# Patient Record
Sex: Male | Born: 1970
Health system: Southern US, Community
[De-identification: ages and names within clinical notes are randomized; demographics above are authoritative.]

## PROBLEM LIST (undated history)

## (undated) DIAGNOSIS — G4730 Sleep apnea, unspecified: Secondary | ICD-10-CM

## (undated) DIAGNOSIS — R6882 Decreased libido: Secondary | ICD-10-CM

## (undated) DIAGNOSIS — N410 Acute prostatitis: Secondary | ICD-10-CM

## (undated) DIAGNOSIS — E782 Mixed hyperlipidemia: Secondary | ICD-10-CM

## (undated) DIAGNOSIS — R5383 Other fatigue: Secondary | ICD-10-CM

## (undated) DIAGNOSIS — J40 Bronchitis, not specified as acute or chronic: Secondary | ICD-10-CM

## (undated) DIAGNOSIS — R5381 Other malaise: Secondary | ICD-10-CM

## (undated) DIAGNOSIS — C801 Malignant (primary) neoplasm, unspecified: Secondary | ICD-10-CM

## (undated) DIAGNOSIS — G4700 Insomnia, unspecified: Secondary | ICD-10-CM

## (undated) DIAGNOSIS — M47816 Spondylosis without myelopathy or radiculopathy, lumbar region: Secondary | ICD-10-CM

## (undated) DIAGNOSIS — IMO0002 Reserved for concepts with insufficient information to code with codable children: Secondary | ICD-10-CM

## (undated) HISTORY — DX: Spondylosis without myelopathy or radiculopathy, lumbar region: M47.816

## (undated) HISTORY — DX: Other malaise: R53.81

## (undated) HISTORY — PX: HERNIA REPAIR: SHX51

## (undated) HISTORY — PX: APPENDECTOMY: SHX54

## (undated) HISTORY — DX: Sleep apnea, unspecified: G47.30

## (undated) HISTORY — DX: Other malaise: R53.83

## (undated) HISTORY — DX: Mixed hyperlipidemia: E78.2

## (undated) HISTORY — DX: Insomnia, unspecified: G47.00

## (undated) HISTORY — DX: Acute prostatitis: N41.0

## (undated) HISTORY — DX: Decreased libido: R68.82

## (undated) HISTORY — DX: Reserved for concepts with insufficient information to code with codable children: IMO0002

## (undated) HISTORY — DX: Malignant (primary) neoplasm, unspecified: C80.1

## (undated) HISTORY — DX: Bronchitis, not specified as acute or chronic: J40

---

## 2006-02-16 ENCOUNTER — Emergency Department (HOSPITAL_COMMUNITY): Admission: EM | Admit: 2006-02-16 | Discharge: 2006-02-16 | Payer: Self-pay | Admitting: Emergency Medicine

## 2006-12-07 ENCOUNTER — Ambulatory Visit: Payer: Self-pay | Admitting: Family Medicine

## 2008-11-09 ENCOUNTER — Ambulatory Visit: Payer: Self-pay | Admitting: Specialist

## 2008-11-23 ENCOUNTER — Ambulatory Visit: Payer: Self-pay | Admitting: Specialist

## 2009-01-07 ENCOUNTER — Ambulatory Visit: Payer: Self-pay | Admitting: Gastroenterology

## 2012-03-31 ENCOUNTER — Ambulatory Visit: Payer: Self-pay | Admitting: Family Medicine

## 2013-10-25 ENCOUNTER — Encounter: Payer: Self-pay | Admitting: *Deleted

## 2013-11-02 ENCOUNTER — Ambulatory Visit (INDEPENDENT_AMBULATORY_CARE_PROVIDER_SITE_OTHER): Payer: 59 | Admitting: Cardiovascular Disease

## 2013-11-02 ENCOUNTER — Encounter (INDEPENDENT_AMBULATORY_CARE_PROVIDER_SITE_OTHER): Payer: Self-pay

## 2013-11-02 ENCOUNTER — Encounter: Payer: Self-pay | Admitting: Cardiovascular Disease

## 2013-11-02 VITALS — BP 130/86 | HR 67 | Ht 69.0 in | Wt 204.5 lb

## 2013-11-02 DIAGNOSIS — R079 Chest pain, unspecified: Secondary | ICD-10-CM

## 2013-11-02 DIAGNOSIS — R0789 Other chest pain: Secondary | ICD-10-CM

## 2013-11-02 DIAGNOSIS — E782 Mixed hyperlipidemia: Secondary | ICD-10-CM

## 2013-11-02 NOTE — Patient Instructions (Addendum)
Your physician has requested that you have a stress echocardiogram. For further information please visit [email or web address]. Please follow instruction sheet as given. -Wear comfortable  -Tennis shoes  - You can eat and take your meds     Your physician recommends that you schedule a follow-up appointment in:  As needed

## 2013-11-02 NOTE — Assessment & Plan Note (Signed)
He was recently started on atorvastatin. He reports no side effects.

## 2013-11-02 NOTE — Assessment & Plan Note (Signed)
Chest pain is overall atypical but he has associated dyspnea. He does have risk factors for coronary artery disease. Cardiac exam is normal and baseline ECG is unremarkable. I recommend evaluation with a stress echocardiogram. I discussed with the patient the importance of lifestyle changes in order to decrease the chance of future coronary artery disease and cardiovascular events. We discussed the importance of controlling risk factors, healthy diet as well as regular exercise. I also explained to him that a normal stress test does not rule out atherosclerosis.

## 2013-11-02 NOTE — Progress Notes (Signed)
Primary care physician: Dr. Rutherford Nail  HPI  This is a pleasant 43 year old man who was referred for evaluation of chest pain. He has no previous cardiac history. He has known history of hyperlipidemia and was started recently on atorvastatin. He has no history of hypertension, diabetes or tobacco use. He reports family history of coronary artery disease as his father and grandfather had myocardial infarction and congestive heart failure in the 48s. He had an episode of sharp chest pain lasting for about 3 minutes radiating to his back and shoulder back in May. The episode happened at rest and not with physical activities. He reports recurrent symptoms of shortness of breath which is random and happens when he is under stress. He exercises regularly. 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   No Known Allergies   Current Outpatient Prescriptions on File Prior to Visit  Medication Sig Dispense Refill  . clotrimazole-betamethasone (LOTRISONE) cream Apply 1 application topically 2 (two) times daily as needed.       No current facility-administered medications on file prior to visit.     Past Medical History  Diagnosis Date  . Bronchitis   . Decreased libido   . Thoracic or lumbosacral neuritis or radiculitis, unspecified   . Other malaise and fatigue   . Insomnia, unspecified   . Acute prostatitis   . Mixed hyperlipidemia      Past Surgical History  Procedure Laterality Date  . Appendectomy    . Hernia repair       Family History  Problem Relation Age of Onset  . Heart disease Father   . Heart attack Father   . Hypertension Father   . Hyperlipidemia Father      History   Social History  . Marital Status: Single    Spouse Name: N/A    Number of Children: N/A  . Years of Education: N/A   Occupational History  . Not on file.   Social History Main Topics  . Smoking status: Never Smoker   . Smokeless tobacco: Not on  file  . Alcohol Use: Yes     Comment: occasional  . Drug Use: No  . Sexual Activity: Not on file   Other Topics Concern  . Not on file   Social History Narrative  . No narrative on file     ROS A 10 point review of system was performed. It is negative other than that mentioned in the history of present illness.   PHYSICAL EXAM   BP 130/86  Pulse 67  Ht 5\' 9"  (1.753 m)  Wt 204 lb 8 oz (92.761 kg)  BMI 30.19 kg/m2 Constitutional: He is oriented to person, place, and time. He appears well-developed and well-nourished. No distress.  HENT: No nasal discharge.  Head: Normocephalic and atraumatic.  Eyes: Pupils are equal and round.  No discharge. Neck: Normal range of motion. Neck supple. No JVD present. No thyromegaly present.  Cardiovascular: Normal rate, regular rhythm, normal heart sounds. Exam reveals no gallop and no friction rub. No murmur heard.  Pulmonary/Chest: Effort normal and breath sounds normal. No stridor. No respiratory distress. He has no wheezes. He has no rales. He exhibits no tenderness.  Abdominal: Soft. Bowel sounds are normal. He exhibits no distension. There is no tenderness. There is no rebound and no guarding.  Musculoskeletal: Normal range of motion. He exhibits no edema and no tenderness.  Neurological: He is alert and oriented to person, place,  and time. Coordination normal.  Skin: Skin is warm and dry. No rash noted. He is not diaphoretic. No erythema. No pallor.  Psychiatric: He has a normal mood and affect. His behavior is normal. Judgment and thought content normal.       BZJ:IRCVE  Rhythm  - occasional PAC    # PACs = 1. WITHIN NORMAL LIMITS    ASSESSMENT AND PLAN

## 2013-11-09 ENCOUNTER — Telehealth: Payer: Self-pay

## 2013-11-09 NOTE — Telephone Encounter (Signed)
l mom to schedule echo stress

## 2013-12-04 NOTE — Telephone Encounter (Signed)
Spoke with patient  Reviewed stress echo instructions  Patient verbalized understanding

## 2013-12-05 ENCOUNTER — Other Ambulatory Visit (INDEPENDENT_AMBULATORY_CARE_PROVIDER_SITE_OTHER): Payer: 59

## 2013-12-05 DIAGNOSIS — R079 Chest pain, unspecified: Secondary | ICD-10-CM

## 2013-12-05 DIAGNOSIS — R0602 Shortness of breath: Secondary | ICD-10-CM

## 2013-12-06 NOTE — Progress Notes (Signed)
LVM 9/16

## 2015-08-12 ENCOUNTER — Encounter: Payer: Self-pay | Admitting: Family Medicine

## 2015-08-12 ENCOUNTER — Ambulatory Visit (INDEPENDENT_AMBULATORY_CARE_PROVIDER_SITE_OTHER): Payer: 59 | Admitting: Family Medicine

## 2015-08-12 VITALS — BP 136/84 | HR 74 | Temp 97.6°F | Resp 16 | Wt 206.0 lb

## 2015-08-12 DIAGNOSIS — R6882 Decreased libido: Secondary | ICD-10-CM

## 2015-08-12 DIAGNOSIS — Z125 Encounter for screening for malignant neoplasm of prostate: Secondary | ICD-10-CM | POA: Diagnosis not present

## 2015-08-12 DIAGNOSIS — E782 Mixed hyperlipidemia: Secondary | ICD-10-CM

## 2015-08-12 DIAGNOSIS — R351 Nocturia: Secondary | ICD-10-CM | POA: Diagnosis not present

## 2015-08-12 DIAGNOSIS — G4730 Sleep apnea, unspecified: Secondary | ICD-10-CM | POA: Diagnosis not present

## 2015-08-12 DIAGNOSIS — R350 Frequency of micturition: Secondary | ICD-10-CM | POA: Diagnosis not present

## 2015-08-12 DIAGNOSIS — Z0000 Encounter for general adult medical examination without abnormal findings: Secondary | ICD-10-CM | POA: Diagnosis not present

## 2015-08-12 HISTORY — DX: Sleep apnea, unspecified: G47.30

## 2015-08-12 NOTE — Progress Notes (Signed)
Patient ID: Willie Jones, male   DOB: [birth year], 45 y.o.   MRN: [id number]   Subjective:   Willie Jones is a 45 y.o. male here for a complete physical exam  Interim issues since last visit: wife wanted him to mention sleep apnea; already did the sleep study; Dr. Rutherford Nail ordered the test, supposed to wearing CPAP; never got it; avoiding sleeping on his back; fatigued and tired through the day; not refreshing sleep; fit bit helping him see he only gets 3.5 to 4 hours a night; years of rotating shifts, slightest thing wakes him  USPSTF grade A and B recommendations Alcohol: just occasional Depression:  Depression screen [facility] 2/9 08/12/2015  Decreased Interest 0  Down, Depressed, Hopeless 0  PHQ - 2 Score 0  Hypertension: higher than normal recently Obesity: would like to lose a little Tobacco use: never HIV, hep B, hep C: has been tested STD testing and prevention (chl/gon/syphilis): not needed Lipids: today Glucose: today Colorectal cancer: no one in the fam; start at age 64 Breast cancer: no lumps Lung cancer: n/a Osteoporosis: n/a AAA: n/a Aspirin: start baby aspirin 81 mg coated daily Diet: limiting portions Exercise: very physically fit Skin cancer: hx of skin cancer; sees derm regularly; no new moles  Past Medical History  Diagnosis Date  . Bronchitis   . Decreased libido   . Thoracic or lumbosacral neuritis or radiculitis, unspecified   . Other malaise and fatigue   . Insomnia, unspecified   . Acute prostatitis   . Mixed hyperlipidemia   . Cancer (HCC)     skin(face)  . Sleep apnea 08/12/2015  hx of Lyme disease; Dr. Clayborn Bigness  Past Surgical History  Procedure Laterality Date  . Appendectomy    . Hernia repair     Family History  Problem Relation Age of Onset  . Heart disease Father   . Heart attack Father   . Hypertension Father   . Hyperlipidemia Father    Social History  Substance Use Topics  . Smoking status: Never Smoker   . Smokeless tobacco:  Not on file  . Alcohol Use: Yes     Comment: occasional   Review of Systems  Constitutional: Negative for unexpected weight change.  HENT: Positive for hearing loss (might be slipping a little; declined audiologist for now).   Eyes: Positive for visual disturbance (wears glasses).  Respiratory: Positive for shortness of breath (with exercise, comes quicker than it should he thinks; did stress test already; will get OSA treated, he'll let me know if continuing).   Cardiovascular: Negative for chest pain.  Gastrointestinal: Negative for blood in stool.  Endocrine: Negative for polydipsia.  Genitourinary: Negative for hematuria.  Musculoskeletal: Positive for back pain (some back pain after accident, not taking naproxen for a long time).  Allergic/Immunologic: Negative for food allergies.  Neurological: Negative for tremors.  Hematological: Does not bruise/bleed easily.  Psychiatric/Behavioral: Negative for dysphoric mood.   Objective:   Filed Vitals:   08/12/15 0821  BP: 136/84  Pulse: 74  Temp: 97.6 F (36.4 C)  TempSrc: Oral  Resp: 16  Weight: 206 lb (93.441 kg)  SpO2: 97%   Body mass index is 30.41 kg/(m^2). Wt Readings from Last 3 Encounters:  08/12/15 206 lb (93.441 kg)  11/02/13 204 lb 8 oz (92.761 kg)  fair amount of muscle mass  Physical Exam  Constitutional: He appears well-developed and well-nourished. No distress.  Obese  HENT:  Head: Normocephalic and atraumatic.  Nose: Nose normal.  Mouth/Throat: Oropharynx is clear and moist.  Eyes: EOM are normal. No scleral icterus.  Neck: No JVD present. No thyromegaly present.  Cardiovascular: Normal rate, regular rhythm and normal heart sounds.   Pulmonary/Chest: Effort normal and breath sounds normal. No respiratory distress. He has no wheezes. He has no rales.  Abdominal: Soft. Bowel sounds are normal. He exhibits no distension. There is no tenderness. There is no guarding.  Musculoskeletal: Normal range of motion.  He exhibits no edema.  Lymphadenopathy:    He has no cervical adenopathy.  Neurological: He is alert. He displays normal reflexes. He exhibits normal muscle tone. Coordination normal.  Skin: Skin is warm and dry. No rash noted. He is not diaphoretic. No erythema. No pallor.  Psychiatric: He has a normal mood and affect. His behavior is normal. Judgment and thought content normal.    Assessment/Plan:   Problem List Items Addressed This Visit      Other   Decreased libido   Relevant Orders   Testosterone,Free and Total   Mixed hyperlipidemia    Not on statin any more; did biometric screening at work, total 199; HDL was too low, HDL; recheck today      Nocturia   Preventative health care - Primary   Relevant Orders   CBC with Differential/Platelet (Completed)   Lipid Panel w/o Chol/HDL Ratio (Completed)   Comprehensive metabolic panel (Completed)   TSH (Completed)   Sleep apnea   Relevant Orders   Ambulatory referral to Neurology   Urinary frequency    Other Visit Diagnoses    Prostate cancer screening        Relevant Orders    PSA (Completed)       No orders of the defined types were placed in this encounter.   Orders Placed This Encounter  Procedures  . CBC with Differential/Platelet  . Lipid Panel w/o Chol/HDL Ratio    Order Specific Question:  Has the patient fasted?    Answer:  Yes  . Comprehensive metabolic panel    Order Specific Question:  Has the patient fasted?    Answer:  Yes  . TSH  . Testosterone,Free and Total  . PSA  . Ambulatory referral to Neurology    Referral Priority:  Medium    Referral Type:  Consultation    Referral Reason:  Specialty Services Required    Requested Specialty:  Neurology    Number of Visits Requested:  1   Follow up plan: Return in about 1 year (around 08/11/2016) for complete physical.  An after-visit summary was printed and given to the patient at [facility].  Please see the patient instructions which may contain other  information and recommendations beyond what is mentioned above in the assessment and plan.

## 2015-08-12 NOTE — Assessment & Plan Note (Signed)
Not on statin any more; did biometric screening at work, total 199; HDL was too low, HDL; recheck today

## 2015-08-12 NOTE — Patient Instructions (Addendum)
Your goal blood pressure is less than 140 mmHg on top. Try to follow the DASH guidelines (DASH stands for Dietary Approaches to Stop Hypertension) Try to limit the sodium in your diet.  Ideally, consume less than 1.5 grams (less than 1,500mg ) per day. Do not add salt when cooking or at the table.  Check the sodium amount on labels when shopping, and choose items lower in sodium when given a choice. Avoid or limit foods that already contain a lot of sodium. Eat a diet rich in fruits and vegetables and whole grains.    Health Maintenance, Male A healthy lifestyle and preventative care can promote health and wellness.  Maintain regular health, dental, and eye exams.  Eat a healthy diet. Foods like vegetables, fruits, whole grains, low-fat dairy products, and lean protein foods contain the nutrients you need and are low in calories. Decrease your intake of foods high in solid fats, added sugars, and salt. Get information about a proper diet from your health care provider, if necessary.  Regular physical exercise is one of the most important things you can do for your health. Most adults should get at least 150 minutes of moderate-intensity exercise (any activity that increases your heart rate and causes you to sweat) each week. In addition, most adults need muscle-strengthening exercises on 2 or more days a week.   Maintain a healthy weight. The body mass index (BMI) is a screening tool to identify possible weight problems. It provides an estimate of body fat based on height and weight. Your health care provider can find your BMI and can help you achieve or maintain a healthy weight. For males 20 years and older:  A BMI below 18.5 is considered underweight.  A BMI of 18.5 to 24.9 is normal.  A BMI of 25 to 29.9 is considered overweight.  A BMI of 30 and above is considered obese.  Maintain normal blood lipids and cholesterol by exercising and minimizing your intake of saturated fat. Eat a  balanced diet with plenty of fruits and vegetables. Blood tests for lipids and cholesterol should begin at age 77 and be repeated every 5 years. If your lipid or cholesterol levels are high, you are over age 89, or you are at high risk for heart disease, you may need your cholesterol levels checked more frequently.Ongoing high lipid and cholesterol levels should be treated with medicines if diet and exercise are not working.  If you smoke, find out from your health care provider how to quit. If you do not use tobacco, do not start.  Lung cancer screening is recommended for adults aged 75-80 years who are at high risk for developing lung cancer because of a history of smoking. A yearly low-dose CT scan of the lungs is recommended for people who have at least a 30-pack-year history of smoking and are current smokers or have quit within the past 15 years. A pack year of smoking is smoking an average of 1 pack of cigarettes a day for 1 year (for example, a 30-pack-year history of smoking could mean smoking 1 pack a day for 30 years or 2 packs a day for 15 years). Yearly screening should continue until the smoker has stopped smoking for at least 15 years. Yearly screening should be stopped for people who develop a health problem that would prevent them from having lung cancer treatment.  If you choose to drink alcohol, do not have more than 2 drinks per day. One drink is considered to be  12 oz (360 mL) of beer, 5 oz (150 mL) of wine, or 1.5 oz (45 mL) of liquor.  Avoid the use of street drugs. Do not share needles with anyone. Ask for help if you need support or instructions about stopping the use of drugs.  High blood pressure causes heart disease and increases the risk of stroke. High blood pressure is more likely to develop in:  People who have blood pressure in the end of the normal range (100-139/85-89 mm Hg).  People who are overweight or obese.  People who are African American.  If you are 18-39  years of age, have your blood pressure checked every 3-5 years. If you are 40 years of age or older, have your blood pressure checked every year. You should have your blood pressure measured twice--once when you are at a hospital or clinic, and once when you are not at a hospital or clinic. Record the average of the two measurements. To check your blood pressure when you are not at a hospital or clinic, you can use:  An automated blood pressure machine at a pharmacy.  A home blood pressure monitor.  If you are 45-79 years old, ask your health care provider if you should take aspirin to prevent heart disease.  Diabetes screening involves taking a blood sample to check your fasting blood sugar level. This should be done once every 3 years after age 45 if you are at a normal weight and without risk factors for diabetes. Testing should be considered at a younger age or be carried out more frequently if you are overweight and have at least 1 risk factor for diabetes.  Colorectal cancer can be detected and often prevented. Most routine colorectal cancer screening begins at the age of 50 and continues through age 75. However, your health care provider may recommend screening at an earlier age if you have risk factors for colon cancer. On a yearly basis, your health care provider may provide home test kits to check for hidden blood in the stool. A small camera at the end of a tube may be used to directly examine the colon (sigmoidoscopy or colonoscopy) to detect the earliest forms of colorectal cancer. Talk to your health care provider about this at age 50 when routine screening begins. A direct exam of the colon should be repeated every 5-10 years through age 75, unless early forms of precancerous polyps or small growths are found.  People who are at an increased risk for hepatitis B should be screened for this virus. You are considered at high risk for hepatitis B if:  You were born in a country where  hepatitis B occurs often. Talk with your health care provider about which countries are considered high risk.  Your parents were born in a high-risk country and you have not received a shot to protect against hepatitis B (hepatitis B vaccine).  You have HIV or AIDS.  You use needles to inject street drugs.  You live with, or have sex with, someone who has hepatitis B.  You are a man who has sex with other men (MSM).  You get hemodialysis treatment.  You take certain medicines for conditions like cancer, organ transplantation, and autoimmune conditions.  Hepatitis C blood testing is recommended for all people born from 1945 through 1965 and any individual with known risk factors for hepatitis C.  Healthy men should no longer receive prostate-specific antigen (PSA) blood tests as part of routine cancer screening. Talk to your   health care provider about prostate cancer screening.  Testicular cancer screening is not recommended for adolescents or adult males who have no symptoms. Screening includes self-exam, a health care provider exam, and other screening tests. Consult with your health care provider about any symptoms you have or any concerns you have about testicular cancer.  Practice safe sex. Use condoms and avoid high-risk sexual practices to reduce the spread of sexually transmitted infections (STIs).  You should be screened for STIs, including gonorrhea and chlamydia if:  You are sexually active and are younger than 24 years.  You are older than 24 years, and your health care provider tells you that you are at risk for this type of infection.  Your sexual activity has changed since you were last screened, and you are at an increased risk for chlamydia or gonorrhea. Ask your health care provider if you are at risk.  If you are at risk of being infected with HIV, it is recommended that you take a prescription medicine daily to prevent HIV infection. This is called pre-exposure  prophylaxis (PrEP). You are considered at risk if:  You are a man who has sex with other men (MSM).  You are a heterosexual man who is sexually active with multiple partners.  You take drugs by injection.  You are sexually active with a partner who has HIV.  Talk with your health care provider about whether you are at high risk of being infected with HIV. If you choose to begin PrEP, you should first be tested for HIV. You should then be tested every 3 months for as long as you are taking PrEP.  Use sunscreen. Apply sunscreen liberally and repeatedly throughout the day. You should seek shade when your shadow is shorter than you. Protect yourself by wearing long sleeves, pants, a wide-brimmed hat, and sunglasses year round whenever you are outdoors.  Tell your health care provider of new moles or changes in moles, especially if there is a change in shape or color. Also, tell your health care provider if a mole is larger than the size of a pencil eraser.  A one-time screening for abdominal aortic aneurysm (AAA) and surgical repair of large AAAs by ultrasound is recommended for men aged 73-75 years who are current or former smokers.  Stay current with your vaccines (immunizations).   This information is not intended to replace advice given to you by your health care provider. Make sure you discuss any questions you have with your health care provider.   Document Released: 09/05/2007 Document Revised: 03/30/2014 Document Reviewed: 08/04/2010 Elsevier Interactive Patient Education 2016 [place] DASH stands for "Dietary Approaches to Stop Hypertension." The DASH eating plan is a healthy eating plan that has been shown to reduce high blood pressure (hypertension). Additional health benefits may include reducing the risk of type 2 diabetes mellitus, heart disease, and stroke. The DASH eating plan may also help with weight loss. WHAT DO I NEED TO KNOW ABOUT THE DASH EATING  PLAN? For the DASH eating plan, you will follow these general guidelines:  Choose foods with a percent daily value for sodium of less than 5% (as listed on the food label).  Use salt-free seasonings or herbs instead of table salt or sea salt.  Check with your health care provider or pharmacist before using salt substitutes.  Eat lower-sodium products, often labeled as "lower sodium" or "no salt added."  Eat fresh foods.  Eat more vegetables, fruits, and low-fat dairy products.  Choose whole grains. Look for the word "whole" as the first word in the ingredient list.  Choose fish and skinless chicken or [place] more often than red meat. Limit fish, poultry, and meat to 6 oz (170 g) each day.  Limit sweets, desserts, sugars, and sugary drinks.  Choose heart-healthy fats.  Limit cheese to 1 oz (28 g) per day.  Eat more home-cooked food and less restaurant, buffet, and fast food.  Limit fried foods.  Cook foods using methods other than frying.  Limit canned vegetables. If you do use them, rinse them well to decrease the sodium.  When eating at a restaurant, ask that your food be prepared with less salt, or no salt if possible. WHAT FOODS CAN I EAT? Seek help from a dietitian for individual calorie needs. Grains Whole grain or whole wheat bread. Brown rice. Whole grain or whole wheat pasta. Quinoa, bulgur, and whole grain cereals. Low-sodium cereals. Corn or whole wheat flour tortillas. Whole grain cornbread. Whole grain crackers. Low-sodium crackers. Vegetables Fresh or frozen vegetables (raw, steamed, roasted, or grilled). Low-sodium or reduced-sodium tomato and vegetable juices. Low-sodium or reduced-sodium tomato sauce and paste. Low-sodium or reduced-sodium canned vegetables.  Fruits All fresh, canned (in natural juice), or frozen fruits. Meat and Other Protein Products Ground beef (85% or leaner), grass-fed beef, or beef trimmed of fat. Skinless chicken or [place]. Ground  chicken or [place]. Pork trimmed of fat. All fish and seafood. Eggs. Dried beans, peas, or lentils. Unsalted nuts and seeds. Unsalted canned beans. Dairy Low-fat dairy products, such as skim or 1% milk, 2% or reduced-fat cheeses, low-fat ricotta or cottage cheese, or plain low-fat yogurt. Low-sodium or reduced-sodium cheeses. Fats and Oils Tub margarines without trans fats. Light or reduced-fat mayonnaise and salad dressings (reduced sodium). Avocado. Safflower, olive, or canola oils. Natural peanut or almond butter. Other Unsalted popcorn and pretzels. The items listed above may not be a complete list of recommended foods or beverages. Contact your dietitian for more options. WHAT FOODS ARE NOT RECOMMENDED? Grains White bread. White pasta. White rice. Refined cornbread. Bagels and croissants. Crackers that contain trans fat. Vegetables Creamed or fried vegetables. Vegetables in a cheese sauce. Regular canned vegetables. Regular canned tomato sauce and paste. Regular tomato and vegetable juices. Fruits Dried fruits. Canned fruit in light or heavy syrup. Fruit juice. Meat and Other Protein Products Fatty cuts of meat. Ribs, chicken wings, bacon, sausage, bologna, salami, chitterlings, fatback, hot dogs, bratwurst, and packaged luncheon meats. Salted nuts and seeds. Canned beans with salt. Dairy Whole or 2% milk, cream, half-and-half, and cream cheese. Whole-fat or sweetened yogurt. Full-fat cheeses or blue cheese. Nondairy creamers and whipped toppings. Processed cheese, cheese spreads, or cheese curds. Condiments Onion and garlic salt, seasoned salt, table salt, and sea salt. Canned and packaged gravies. Worcestershire sauce. Tartar sauce. Barbecue sauce. Teriyaki sauce. Soy sauce, including reduced sodium. Steak sauce. Fish sauce. Oyster sauce. Cocktail sauce. Horseradish. Ketchup and mustard. Meat flavorings and tenderizers. Bouillon cubes. Hot sauce. Tabasco sauce. Marinades. Taco seasonings.  Relishes. Fats and Oils Butter, stick margarine, lard, shortening, ghee, and bacon fat. Coconut, palm kernel, or palm oils. Regular salad dressings. Other Pickles and olives. Salted popcorn and pretzels. The items listed above may not be a complete list of foods and beverages to avoid. Contact your dietitian for more information. WHERE CAN I FIND MORE INFORMATION? National Heart, Lung, and Blood Institute: travelstabloid.com   This information is not intended to replace advice given to you by your health  care provider. Make sure you discuss any questions you have with your health care provider.   Document Released: 02/26/2011 Document Revised: 03/30/2014 Document Reviewed: 01/11/2013 Elsevier Interactive Patient Education [employer].

## 2015-08-13 ENCOUNTER — Other Ambulatory Visit: Payer: Self-pay | Admitting: Family Medicine

## 2015-08-13 LAB — COMPREHENSIVE METABOLIC PANEL
A/G RATIO: 1.7 (ref 1.2–2.2)
ALK PHOS: 88 IU/L (ref 39–117)
ALT: 15 IU/L (ref 0–44)
AST: 18 IU/L (ref 0–40)
Albumin: 4.3 g/dL (ref 3.5–5.5)
BILIRUBIN TOTAL: 0.3 mg/dL (ref 0.0–1.2)
BUN / CREAT RATIO: 13 (ref 9–20)
BUN: 13 mg/dL (ref 6–24)
CHLORIDE: 98 mmol/L (ref 96–106)
CO2: 23 mmol/L (ref 18–29)
Calcium: 9.2 mg/dL (ref 8.7–10.2)
Creatinine, Ser: 1.01 mg/dL (ref 0.76–1.27)
GFR calc Af Amer: 104 mL/min/{1.73_m2} (ref >59)
GFR calc non Af Amer: 90 mL/min/{1.73_m2} (ref >59)
GLUCOSE: 94 mg/dL (ref 65–99)
Globulin, Total: 2.5 g/dL (ref 1.5–4.5)
POTASSIUM: 4.2 mmol/L (ref 3.5–5.2)
Sodium: 138 mmol/L (ref 134–144)
Total Protein: 6.8 g/dL (ref 6.0–8.5)

## 2015-08-13 LAB — CBC WITH DIFFERENTIAL/PLATELET
Basophils Absolute: 0 10*3/uL (ref 0.0–0.2)
Basos: 0 %
EOS (ABSOLUTE): 0.1 10*3/uL (ref 0.0–0.4)
EOS: 1 %
HEMATOCRIT: 44.9 % (ref 37.5–51.0)
HEMOGLOBIN: 15.5 g/dL (ref 12.6–17.7)
IMMATURE GRANS (ABS): 0 10*3/uL (ref 0.0–0.1)
IMMATURE GRANULOCYTES: 0 %
LYMPHS: 26 %
Lymphocytes Absolute: 2.2 10*3/uL (ref 0.7–3.1)
MCH: 30.8 pg (ref 26.6–33.0)
MCHC: 34.5 g/dL (ref 31.5–35.7)
MCV: 89 fL (ref 79–97)
MONOCYTES: 7 %
Monocytes Absolute: 0.6 10*3/uL (ref 0.1–0.9)
NEUTROS PCT: 66 %
Neutrophils Absolute: 5.6 10*3/uL (ref 1.4–7.0)
Platelets: 226 10*3/uL (ref 150–379)
RBC: 5.03 x10E6/uL (ref 4.14–5.80)
RDW: 13.7 % (ref 12.3–15.4)
WBC: 8.6 10*3/uL (ref 3.4–10.8)

## 2015-08-13 LAB — LIPID PANEL W/O CHOL/HDL RATIO
Cholesterol, Total: 190 mg/dL (ref 100–199)
HDL: 28 mg/dL — AB (ref >39)
TRIGLYCERIDES: 681 mg/dL — AB (ref 0–149)

## 2015-08-13 LAB — TSH: TSH: 2.15 u[IU]/mL (ref 0.450–4.500)

## 2015-08-13 LAB — PSA: Prostate Specific Ag, Serum: 0.3 ng/mL (ref 0.0–4.0)

## 2015-08-13 MED ORDER — ICOSAPENT ETHYL 1 G PO CAPS: 2.0000 | ORAL_CAPSULE | Freq: Two times a day (BID) | ORAL | Status: DC

## 2015-11-22 ENCOUNTER — Ambulatory Visit: Payer: 59 | Attending: Neurology

## 2015-11-22 DIAGNOSIS — G4733 Obstructive sleep apnea (adult) (pediatric): Secondary | ICD-10-CM | POA: Diagnosis present

## 2016-03-23 ENCOUNTER — Other Ambulatory Visit: Payer: Self-pay | Admitting: Family Medicine

## 2016-03-23 DIAGNOSIS — E781 Pure hyperglyceridemia: Secondary | ICD-10-CM

## 2016-03-25 DIAGNOSIS — E781 Pure hyperglyceridemia: Secondary | ICD-10-CM | POA: Insufficient documentation

## 2016-03-25 NOTE — Assessment & Plan Note (Signed)
Check lipids 

## 2016-03-25 NOTE — Telephone Encounter (Signed)
Called patient. Notified patient Refill request was approved and he would need to have his lipid panel checked.

## 2016-03-25 NOTE — Telephone Encounter (Signed)
Glitch in the system; I was not getting electronic refill requests from Dec 13th until yesterday; addressed with IT; handling refill requests now --------------------- Please ask patient to stop by for fasting cholesterol panel to see how the Dalene Seltzer is working Thank you

## 2016-04-10 DIAGNOSIS — G4733 Obstructive sleep apnea (adult) (pediatric): Secondary | ICD-10-CM | POA: Diagnosis not present

## 2016-04-29 ENCOUNTER — Other Ambulatory Visit: Payer: Self-pay | Admitting: Emergency Medicine

## 2016-04-29 DIAGNOSIS — E782 Mixed hyperlipidemia: Secondary | ICD-10-CM

## 2016-04-30 LAB — LIPID PANEL
CHOLESTEROL TOTAL: 156 mg/dL (ref 100–199)
Chol/HDL Ratio: 4.1 ratio units (ref 0.0–5.0)
HDL: 38 mg/dL — AB (ref >39)
LDL Calculated: 89 mg/dL (ref 0–99)
TRIGLYCERIDES: 146 mg/dL (ref 0–149)
VLDL CHOLESTEROL CAL: 29 mg/dL (ref 5–40)

## 2016-05-11 DIAGNOSIS — G4733 Obstructive sleep apnea (adult) (pediatric): Secondary | ICD-10-CM | POA: Diagnosis not present

## 2016-05-18 DIAGNOSIS — G4733 Obstructive sleep apnea (adult) (pediatric): Secondary | ICD-10-CM | POA: Diagnosis not present

## 2016-05-20 ENCOUNTER — Other Ambulatory Visit: Payer: Self-pay | Admitting: Family Medicine

## 2016-06-08 DIAGNOSIS — G4733 Obstructive sleep apnea (adult) (pediatric): Secondary | ICD-10-CM | POA: Diagnosis not present

## 2016-07-09 DIAGNOSIS — G4733 Obstructive sleep apnea (adult) (pediatric): Secondary | ICD-10-CM | POA: Diagnosis not present

## 2016-08-08 DIAGNOSIS — G4733 Obstructive sleep apnea (adult) (pediatric): Secondary | ICD-10-CM | POA: Diagnosis not present

## 2016-09-08 DIAGNOSIS — G4733 Obstructive sleep apnea (adult) (pediatric): Secondary | ICD-10-CM | POA: Diagnosis not present

## 2016-10-09 DIAGNOSIS — G4733 Obstructive sleep apnea (adult) (pediatric): Secondary | ICD-10-CM | POA: Diagnosis not present

## 2017-01-19 DIAGNOSIS — G4730 Sleep apnea, unspecified: Secondary | ICD-10-CM | POA: Diagnosis not present

## 2017-01-26 DIAGNOSIS — G4733 Obstructive sleep apnea (adult) (pediatric): Secondary | ICD-10-CM | POA: Diagnosis not present

## 2017-02-01 ENCOUNTER — Emergency Department
Admission: EM | Admit: 2017-02-01 | Discharge: 2017-02-01 | Disposition: A | Discharge status: Home / Self Care | Payer: Worker's Compensation | Attending: Emergency Medicine | Admitting: Emergency Medicine

## 2017-02-01 ENCOUNTER — Other Ambulatory Visit: Payer: Self-pay

## 2017-02-01 ENCOUNTER — Emergency Department: Payer: Worker's Compensation

## 2017-02-01 ENCOUNTER — Encounter: Payer: Self-pay | Admitting: Emergency Medicine

## 2017-02-01 DIAGNOSIS — Y9302 Activity, running: Secondary | ICD-10-CM | POA: Diagnosis not present

## 2017-02-01 DIAGNOSIS — Y990 Civilian activity done for income or pay: Secondary | ICD-10-CM | POA: Diagnosis not present

## 2017-02-01 DIAGNOSIS — S86812A Strain of other muscle(s) and tendon(s) at lower leg level, left leg, initial encounter: Secondary | ICD-10-CM | POA: Diagnosis not present

## 2017-02-01 DIAGNOSIS — S8992XA Unspecified injury of left lower leg, initial encounter: Secondary | ICD-10-CM | POA: Diagnosis present

## 2017-02-01 DIAGNOSIS — M25562 Pain in left knee: Secondary | ICD-10-CM | POA: Diagnosis not present

## 2017-02-01 DIAGNOSIS — Y929 Unspecified place or not applicable: Secondary | ICD-10-CM | POA: Insufficient documentation

## 2017-02-01 DIAGNOSIS — X500XXA Overexertion from strenuous movement or load, initial encounter: Secondary | ICD-10-CM | POA: Diagnosis not present

## 2017-02-01 DIAGNOSIS — Y9379 Activity, other specified sports and athletics: Secondary | ICD-10-CM | POA: Diagnosis not present

## 2017-02-01 MED ORDER — HYDROCODONE-ACETAMINOPHEN 5-325 MG PO TABS
2.0000 | ORAL_TABLET | Freq: Once | ORAL | Status: AC
  Administered 2017-02-01: 2 via ORAL

## 2017-02-01 MED ORDER — FENTANYL CITRATE (PF) 100 MCG/2ML IJ SOLN
100.0000 ug | Freq: Once | INTRAMUSCULAR | Status: AC
  Administered 2017-02-01: 100 ug via INTRAVENOUS
  Filled 2017-02-01: qty 2

## 2017-02-01 MED ORDER — ONDANSETRON 4 MG PO TBDP: 4.0000 mg | ORAL_TABLET | Freq: Three times a day (TID) | ORAL | 0 refills | Status: DC | PRN

## 2017-02-01 MED ORDER — HYDROCODONE-ACETAMINOPHEN 5-325 MG PO TABS
ORAL_TABLET | ORAL | Status: AC
  Filled 2017-02-01: qty 2

## 2017-02-01 MED ORDER — HYDROCODONE-ACETAMINOPHEN 5-325 MG PO TABS: 1.0000 | ORAL_TABLET | ORAL | 0 refills | Status: DC | PRN

## 2017-02-01 MED ORDER — OXYCODONE-ACETAMINOPHEN 5-325 MG PO TABS: 1.0000 | ORAL_TABLET | Freq: Four times a day (QID) | ORAL | 0 refills | Status: DC | PRN

## 2017-02-01 MED ORDER — OXYCODONE-ACETAMINOPHEN 5-325 MG PO TABS
2.0000 | ORAL_TABLET | Freq: Once | ORAL | Status: DC
  Filled 2017-02-01: qty 2

## 2017-02-01 NOTE — ED Notes (Signed)
Pt given water for urine sample 

## 2017-02-01 NOTE — ED Triage Notes (Addendum)
Pt states at PT training for work. Running and twisted left knee when tripped on a bolt in the floor . 100 mcg fentanyl in route. VSS.

## 2017-02-01 NOTE — ED Provider Notes (Signed)
[facility] Emergency Department Provider Note  ____________________________________________  Time seen: Approximately 7:17 PM  I have reviewed the triage vital signs and the nursing notes.   HISTORY  Chief Complaint Fall; Leg Injury; and Knee Pain    HPI Willie Jones is a 46 y.o. male who presents the emergency department via EMS status post knee injury.  Patient was performing physical training as part of his job when he went to plant his left foot.  Patient reports there was a loose bulge on the floor, causing his knee to hyperextend before becoming planted on the floor.  Patient reports that he felt a sudden sharp pain which she describes as "someone taking a baseball bat to my knee".  Patient reports that he has not been able to bend his knee effectively or bear any weight on his left lower extremity since the injury.  Patient presented via EMS and received 100 mcg of fentanyl prior to arrival.  Patient reports that this helps considerably with his pain.  Patient has no history of previous knee injury or surgery.  Patient denies any other musculoskeletal complaints at this time.  The patient did not hit his head or lose consciousness.  Past Medical History:  Diagnosis Date  . Acute prostatitis   . Bronchitis   . Cancer (HCC)    skin(face)  . Decreased libido   . Insomnia, unspecified   . Mixed hyperlipidemia   . Other malaise and fatigue   . Sleep apnea 08/12/2015  . Thoracic or lumbosacral neuritis or radiculitis, unspecified     Patient Active Problem List   Diagnosis Date Noted  . Hypertriglyceridemia 03/25/2016  . Preventative health care 08/12/2015  . Sleep apnea 08/12/2015  . Urinary frequency 08/12/2015  . Nocturia 08/12/2015  . Decreased libido 08/12/2015  . Atypical chest pain 11/02/2013  . Mixed hyperlipidemia     Past Surgical History:  Procedure Laterality Date  . APPENDECTOMY    . HERNIA REPAIR      Prior to Admission  medications   Medication Sig Start Date End Date Taking? Authorizing Provider  clotrimazole-betamethasone (LOTRISONE) cream Apply 1 application topically 2 (two) times daily as needed.    [provider]  HYDROcodone-acetaminophen (NORCO/VICODIN) 5-325 MG tablet Take 1 tablet every 4 (four) hours as needed by mouth for moderate pain. 02/01/17   Cuthriell, Charline Bills, PA-C  ondansetron (ZOFRAN-ODT) 4 MG disintegrating tablet Take 1 tablet (4 mg total) every 8 (eight) hours as needed by mouth for nausea or vomiting. 02/01/17   Cuthriell, Charline Bills, PA-C  VASCEPA 1 g CAPS take 2 capsules by mouth twice a day 05/20/16   Arnetha Courser, MD    Allergies Patient has no known allergies.  Family History  Problem Relation Age of Onset  . Heart disease Father   . Heart attack Father   . Hypertension Father   . Hyperlipidemia Father     Social History Social History   Tobacco Use  . Smoking status: Never Smoker  . Smokeless tobacco: Never Used  Substance Use Topics  . Alcohol use: Yes    Comment: occasional  . Drug use: No     Review of Systems  Constitutional: No fever/chills Eyes: No visual changes.  Cardiovascular: no chest pain. Respiratory: no cough. No SOB. Gastrointestinal: No abdominal pain.  No nausea, no vomiting.   Musculoskeletal: Positive for sharp left knee pain Skin: Negative for rash, abrasions, lacerations, ecchymosis. Neurological: Negative for headaches, focal weakness or numbness.  10-point ROS otherwise negative.  ____________________________________________   PHYSICAL EXAM:  VITAL SIGNS: ED Triage Vitals  Enc Vitals Group     BP 02/01/17 1841 123/72     Pulse Rate 02/01/17 1841 (!) 107     Resp 02/01/17 1841 (!) 22     Temp 02/01/17 1841 98.2 F (36.8 C)     Temp Source 02/01/17 1841 Oral     SpO2 02/01/17 1841 96 %     Weight 02/01/17 1842 204 lb (92.5 kg)     Height 02/01/17 1842 5\' 9"  (1.753 m)     Head Circumference --      Peak Flow  --      Pain Score 02/01/17 1841 9     Pain Loc --      Pain Edu? --      Excl. in [place]? --      Constitutional: Alert and oriented. Well appearing and in no acute distress. Eyes: Conjunctivae are normal. PERRL. EOMI. Head: Atraumatic. Neck: No stridor.    Cardiovascular: Normal rate, regular rhythm. Normal S1 and S2.  Good peripheral circulation. Respiratory: Normal respiratory effort without tachypnea or retractions. Lungs CTAB. Good air entry to the bases with no decreased or absent breath sounds. Musculoskeletal: Limited range of motion to the left lower extremity.  Visualization reveals deformity to the anterior knee.  Palpation reveals tenderness diffusely over the anterior aspect of the knee.  Patella is retracted proximally.  Palpation over patellar tendon reveals appreciable abnormality.  On palpation, unable to ascertain exact region of rupture.  Due to findings, further stress testing of the knee is not undertaken at this time.  Dorsalis pedis pulse intact distally.  Sensation intact distally. Neurologic:  Normal speech and language. No gross focal neurologic deficits are appreciated.  Skin:  Skin is warm, dry and intact. No rash noted. Psychiatric: Mood and affect are normal. Speech and behavior are normal. Patient exhibits appropriate insight and judgement.   ____________________________________________   LABS (all labs ordered are listed, but only abnormal results are displayed)  Labs Reviewed - No data to display ____________________________________________  EKG   ____________________________________________  RADIOLOGY Diamantina Providence Cuthriell, personally viewed and evaluated these images (plain radiographs) as part of my medical decision making, as well as reviewing the written report by the radiologist.  Dg Knee Complete 4 Views Left  Result Date: 02/01/2017 CLINICAL DATA:  46 year old male with recent history of left knee injury complaining of pain in the left  knee. EXAM: LEFT KNEE - COMPLETE 4+ VIEW COMPARISON:  No priors. FINDINGS: Patella alta. No acute displaced fracture. No focal osseous lesion. IMPRESSION: 1. Patella alta. 2. Negative for fracture. Electronically Signed   By: Vinnie Langton M.D.   On: 02/01/2017 19:16    ____________________________________________    PROCEDURES  Procedure(s) performed:    Procedures    Medications  fentaNYL (SUBLIMAZE) injection 100 mcg (100 mcg Intravenous Given 02/01/17 2044)  HYDROcodone-acetaminophen (NORCO/VICODIN) 5-325 MG per tablet 2 tablet (2 tablets Oral Given 02/01/17 2051)     ____________________________________________   INITIAL IMPRESSION / ASSESSMENT AND PLAN / ED COURSE  Pertinent labs & imaging results that were available during my care of the patient were reviewed by me and considered in my medical decision making (see chart for details).  Review of the [place] CSRS was performed in accordance of the [place] prior to dispensing any controlled drugs.     Patient's diagnosis is consistent with patella alta from patellar tendon rupture.  X-ray  reveals patella alta.  Based off of patient's history, physical exam, this is from acute patellar tendon rupture.  I discussed the case with on-call orthopedic provider, Dr Roland Rack.  He advises to place patient in knee immobilizer and follow-up with his office in the morning for surgery.  Patient will be discharged home with prescriptions for pain medication and antinausea medicine.  Patient will follow up with orthopedics in the morning.  Patient is given ED precautions to return to the ED for any worsening or new symptoms.     ____________________________________________  FINAL CLINICAL IMPRESSION(S) / ED DIAGNOSES  Final diagnoses:  Rupture of left patellar tendon, initial encounter      NEW MEDICATIONS STARTED DURING THIS VISIT:  ED Discharge Orders        Ordered    oxyCODONE-acetaminophen (ROXICET) 5-325 MG tablet  Every 6  hours PRN,   Status:  Discontinued     02/01/17 2037    ondansetron (ZOFRAN-ODT) 4 MG disintegrating tablet  Every 8 hours PRN,   Status:  Discontinued     02/01/17 2037    HYDROcodone-acetaminophen (NORCO/VICODIN) 5-325 MG tablet  Every 4 hours PRN     02/01/17 2048    ondansetron (ZOFRAN-ODT) 4 MG disintegrating tablet  Every 8 hours PRN     02/01/17 2050          This chart was dictated using voice recognition software/Dragon. Despite best efforts to proofread, errors can occur which can change the meaning. Any change was purely unintentional.    Darletta Moll, PA-C 02/01/17 2059    Nena Polio, MD 02/01/17 2350

## 2017-02-01 NOTE — ED Notes (Signed)
Workers comp completed and urine taken to the lab.

## 2017-02-02 ENCOUNTER — Ambulatory Visit
Admission: RE | Admit: 2017-02-02 | Discharge: 2017-02-02 | Disposition: A | Discharge status: Home / Self Care | Payer: Worker's Compensation | Source: Ambulatory Visit | Attending: Surgery | Admitting: Surgery

## 2017-02-02 ENCOUNTER — Other Ambulatory Visit: Payer: Self-pay

## 2017-02-02 ENCOUNTER — Encounter
Admission: RE | Disposition: A | Discharge status: Home / Self Care | Payer: Self-pay | Source: Ambulatory Visit | Attending: Surgery

## 2017-02-02 ENCOUNTER — Encounter: Payer: Self-pay | Admitting: *Deleted

## 2017-02-02 ENCOUNTER — Ambulatory Visit: Payer: Worker's Compensation | Admitting: Anesthesiology

## 2017-02-02 DIAGNOSIS — Z7982 Long term (current) use of aspirin: Secondary | ICD-10-CM | POA: Insufficient documentation

## 2017-02-02 DIAGNOSIS — X509XXA Other and unspecified overexertion or strenuous movements or postures, initial encounter: Secondary | ICD-10-CM | POA: Diagnosis not present

## 2017-02-02 DIAGNOSIS — S8992XA Unspecified injury of left lower leg, initial encounter: Secondary | ICD-10-CM | POA: Diagnosis present

## 2017-02-02 DIAGNOSIS — Y990 Civilian activity done for income or pay: Secondary | ICD-10-CM | POA: Insufficient documentation

## 2017-02-02 DIAGNOSIS — S86812A Strain of other muscle(s) and tendon(s) at lower leg level, left leg, initial encounter: Secondary | ICD-10-CM | POA: Insufficient documentation

## 2017-02-02 DIAGNOSIS — G4730 Sleep apnea, unspecified: Secondary | ICD-10-CM | POA: Insufficient documentation

## 2017-02-02 DIAGNOSIS — Y92214 College as the place of occurrence of the external cause: Secondary | ICD-10-CM | POA: Diagnosis not present

## 2017-02-02 DIAGNOSIS — Z9989 Dependence on other enabling machines and devices: Secondary | ICD-10-CM | POA: Insufficient documentation

## 2017-02-02 DIAGNOSIS — Y9302 Activity, running: Secondary | ICD-10-CM | POA: Diagnosis not present

## 2017-02-02 HISTORY — PX: PATELLAR TENDON REPAIR: SHX737

## 2017-02-02 SURGERY — REPAIR, TENDON, PATELLAR
Anesthesia: General | Site: Knee | Laterality: Left | Wound class: Clean

## 2017-02-02 MED ORDER — KETOROLAC TROMETHAMINE 30 MG/ML IJ SOLN
INTRAMUSCULAR | Status: DC | PRN
  Administered 2017-02-02: 30 mg via INTRAVENOUS

## 2017-02-02 MED ORDER — MIDAZOLAM HCL 2 MG/2ML IJ SOLN
INTRAMUSCULAR | Status: AC
  Administered 2017-02-02: 1 mg via INTRAVENOUS
  Filled 2017-02-02: qty 2

## 2017-02-02 MED ORDER — LIDOCAINE HCL (PF) 1 % IJ SOLN
INTRAMUSCULAR | Status: DC
Start: 2017-02-02 — End: 2017-02-02
  Filled 2017-02-02: qty 5

## 2017-02-02 MED ORDER — HYDROCODONE-ACETAMINOPHEN 5-325 MG PO TABS
ORAL_TABLET | ORAL | Status: AC
  Filled 2017-02-02: qty 1

## 2017-02-02 MED ORDER — MIDAZOLAM HCL 2 MG/2ML IJ SOLN
INTRAMUSCULAR | Status: AC
  Filled 2017-02-02: qty 2

## 2017-02-02 MED ORDER — POTASSIUM CHLORIDE IN NACL 20-0.9 MEQ/L-% IV SOLN
INTRAVENOUS | Status: DC
  Filled 2017-02-02 (×5): qty 1000

## 2017-02-02 MED ORDER — HYDROCODONE-ACETAMINOPHEN 5-325 MG PO TABS
1.0000 | ORAL_TABLET | ORAL | Status: DC | PRN
  Administered 2017-02-02: 1 via ORAL

## 2017-02-02 MED ORDER — PROPOFOL 10 MG/ML IV BOLUS
INTRAVENOUS | Status: AC
  Filled 2017-02-02: qty 20

## 2017-02-02 MED ORDER — PROPOFOL 10 MG/ML IV BOLUS
INTRAVENOUS | Status: DC | PRN
  Administered 2017-02-02: 150 mg via INTRAVENOUS

## 2017-02-02 MED ORDER — FENTANYL CITRATE (PF) 100 MCG/2ML IJ SOLN
50.0000 ug | Freq: Once | INTRAMUSCULAR | Status: AC
  Administered 2017-02-02: 50 ug via INTRAVENOUS

## 2017-02-02 MED ORDER — NEOMYCIN-POLYMYXIN B GU 40-200000 IR SOLN
Status: DC | PRN
  Administered 2017-02-02: 2 mL

## 2017-02-02 MED ORDER — FAMOTIDINE 20 MG PO TABS
ORAL_TABLET | ORAL | Status: AC
  Filled 2017-02-02: qty 1

## 2017-02-02 MED ORDER — FENTANYL CITRATE (PF) 100 MCG/2ML IJ SOLN
INTRAMUSCULAR | Status: AC
  Administered 2017-02-02: 25 ug via INTRAVENOUS
  Filled 2017-02-02: qty 2

## 2017-02-02 MED ORDER — ACETAMINOPHEN 10 MG/ML IV SOLN
INTRAVENOUS | Status: AC
  Filled 2017-02-02: qty 100

## 2017-02-02 MED ORDER — NEOMYCIN-POLYMYXIN B GU 40-200000 IR SOLN
Status: AC
  Filled 2017-02-02: qty 2

## 2017-02-02 MED ORDER — ONDANSETRON HCL 4 MG PO TABS: 4.0000 mg | ORAL_TABLET | Freq: Four times a day (QID) | ORAL | Status: DC | PRN

## 2017-02-02 MED ORDER — FENTANYL CITRATE (PF) 100 MCG/2ML IJ SOLN
25.0000 ug | INTRAMUSCULAR | Status: DC | PRN
  Administered 2017-02-02 (×4): 25 ug via INTRAVENOUS

## 2017-02-02 MED ORDER — BUPIVACAINE-EPINEPHRINE (PF) 0.5% -1:200000 IJ SOLN
INTRAMUSCULAR | Status: AC
  Filled 2017-02-02: qty 30

## 2017-02-02 MED ORDER — ROPIVACAINE HCL 5 MG/ML IJ SOLN
INTRAMUSCULAR | Status: AC
  Filled 2017-02-02: qty 30

## 2017-02-02 MED ORDER — MIDAZOLAM HCL 2 MG/2ML IJ SOLN
1.0000 mg | Freq: Once | INTRAMUSCULAR | Status: AC
  Administered 2017-02-02: 1 mg via INTRAVENOUS

## 2017-02-02 MED ORDER — ONDANSETRON HCL 4 MG/2ML IJ SOLN
INTRAMUSCULAR | Status: AC
  Filled 2017-02-02: qty 2

## 2017-02-02 MED ORDER — METOCLOPRAMIDE HCL 5 MG/ML IJ SOLN: 5.0000 mg | Freq: Three times a day (TID) | INTRAMUSCULAR | Status: DC | PRN

## 2017-02-02 MED ORDER — KETOROLAC TROMETHAMINE 30 MG/ML IJ SOLN
INTRAMUSCULAR | Status: AC
  Filled 2017-02-02: qty 1

## 2017-02-02 MED ORDER — DEXAMETHASONE SODIUM PHOSPHATE 10 MG/ML IJ SOLN
INTRAMUSCULAR | Status: AC
  Filled 2017-02-02: qty 1

## 2017-02-02 MED ORDER — FAMOTIDINE 20 MG PO TABS
20.0000 mg | ORAL_TABLET | Freq: Once | ORAL | Status: AC
  Administered 2017-02-02: 20 mg via ORAL

## 2017-02-02 MED ORDER — DEXAMETHASONE SODIUM PHOSPHATE 10 MG/ML IJ SOLN
INTRAMUSCULAR | Status: DC | PRN
  Administered 2017-02-02: 10 mg via INTRAVENOUS

## 2017-02-02 MED ORDER — CEFAZOLIN SODIUM-DEXTROSE 2-4 GM/100ML-% IV SOLN
2.0000 g | Freq: Once | INTRAVENOUS | Status: AC
  Administered 2017-02-02: 2 g via INTRAVENOUS

## 2017-02-02 MED ORDER — FENTANYL CITRATE (PF) 100 MCG/2ML IJ SOLN
INTRAMUSCULAR | Status: DC | PRN
  Administered 2017-02-02 (×4): 25 ug via INTRAVENOUS

## 2017-02-02 MED ORDER — ACETAMINOPHEN 10 MG/ML IV SOLN
INTRAVENOUS | Status: DC | PRN
  Administered 2017-02-02: 1000 mg via INTRAVENOUS

## 2017-02-02 MED ORDER — FENTANYL CITRATE (PF) 100 MCG/2ML IJ SOLN
INTRAMUSCULAR | Status: AC
  Administered 2017-02-02: 50 ug via INTRAVENOUS
  Filled 2017-02-02: qty 2

## 2017-02-02 MED ORDER — SEVOFLURANE IN SOLN
RESPIRATORY_TRACT | Status: AC
  Filled 2017-02-02: qty 250

## 2017-02-02 MED ORDER — MIDAZOLAM HCL 2 MG/2ML IJ SOLN
INTRAMUSCULAR | Status: DC | PRN
  Administered 2017-02-02: 2 mg via INTRAVENOUS

## 2017-02-02 MED ORDER — METOCLOPRAMIDE HCL 10 MG PO TABS: 5.0000 mg | ORAL_TABLET | Freq: Three times a day (TID) | ORAL | Status: DC | PRN

## 2017-02-02 MED ORDER — ONDANSETRON HCL 4 MG/2ML IJ SOLN: 4.0000 mg | Freq: Four times a day (QID) | INTRAMUSCULAR | Status: DC | PRN

## 2017-02-02 MED ORDER — BUPIVACAINE-EPINEPHRINE (PF) 0.5% -1:200000 IJ SOLN
INTRAMUSCULAR | Status: DC | PRN
  Administered 2017-02-02: 30 mL

## 2017-02-02 MED ORDER — LACTATED RINGERS IV SOLN
INTRAVENOUS | Status: DC
  Administered 2017-02-02 (×2): via INTRAVENOUS

## 2017-02-02 MED ORDER — CEFAZOLIN SODIUM-DEXTROSE 2-4 GM/100ML-% IV SOLN
INTRAVENOUS | Status: AC
  Filled 2017-02-02: qty 100

## 2017-02-02 MED ORDER — FENTANYL CITRATE (PF) 100 MCG/2ML IJ SOLN
INTRAMUSCULAR | Status: AC
  Filled 2017-02-02: qty 2

## 2017-02-02 SURGICAL SUPPLY — 43 items
BANDAGE ACE 4X5 VEL STRL LF (GAUZE/BANDAGES/DRESSINGS) ×3 IMPLANT
BANDAGE ACE 6X5 VEL STRL LF (GAUZE/BANDAGES/DRESSINGS) ×3 IMPLANT
BLADE SURG SZ10 CARB STEEL (BLADE) ×6 IMPLANT
BNDG COHESIVE 4X5 TAN STRL (GAUZE/BANDAGES/DRESSINGS) ×3 IMPLANT
BNDG ESMARK 6X12 TAN STRL LF (GAUZE/BANDAGES/DRESSINGS) ×3 IMPLANT
BRACE KNEE POST OP SHORT (BRACE) ×3 IMPLANT
CANISTER SUCT 1200ML W/VALVE (MISCELLANEOUS) ×3 IMPLANT
CHLORAPREP W/TINT 26ML (MISCELLANEOUS) ×3 IMPLANT
DRAPE IMP U-DRAPE 54X76 (DRAPES) ×6 IMPLANT
ELECT REM PT RETURN 9FT ADLT (ELECTROSURGICAL) ×3
ELECTRODE REM PT RTRN 9FT ADLT (ELECTROSURGICAL) ×1 IMPLANT
FIBERTAPE 2 W/STRL NDL 17 (SUTURE) ×6 IMPLANT
GAUZE PETRO XEROFOAM 1X8 (MISCELLANEOUS) ×3 IMPLANT
GAUZE SPONGE 4X4 12PLY STRL (GAUZE/BANDAGES/DRESSINGS) ×3 IMPLANT
GLOVE BIO SURGEON STRL SZ8 (GLOVE) ×6 IMPLANT
GLOVE INDICATOR 8.0 STRL GRN (GLOVE) ×3 IMPLANT
GOWN STRL REUS W/ TWL LRG LVL3 (GOWN DISPOSABLE) ×2 IMPLANT
GOWN STRL REUS W/ TWL XL LVL3 (GOWN DISPOSABLE) ×1 IMPLANT
GOWN STRL REUS W/TWL LRG LVL3 (GOWN DISPOSABLE) ×6
GOWN STRL REUS W/TWL XL LVL3 (GOWN DISPOSABLE) ×3
IMMBOLIZER KNEE 19 BLUE UNIV (SOFTGOODS) IMPLANT
KIT RM TURNOVER STRD PROC AR (KITS) ×3 IMPLANT
NEEDLE FILTER BLUNT 18X 1/2SAF (NEEDLE) ×2
NEEDLE FILTER BLUNT 18X1 1/2 (NEEDLE) ×1 IMPLANT
NS IRRIG 500ML POUR BTL (IV SOLUTION) ×3 IMPLANT
PACK EXTREMITY ARMC (MISCELLANEOUS) ×3 IMPLANT
PAD CAST CTTN 4X4 STRL (SOFTGOODS) ×2 IMPLANT
PADDING CAST COTTON 4X4 STRL (SOFTGOODS) ×6
SPONGE LAP 18X18 5 PK (GAUZE/BANDAGES/DRESSINGS) ×3 IMPLANT
STAPLER SKIN PROX 35W (STAPLE) ×3 IMPLANT
STOCKINETTE IMPERVIOUS 9X36 MD (GAUZE/BANDAGES/DRESSINGS) ×3 IMPLANT
SUT ETHIBOND #5 BRAIDED 30INL (SUTURE) ×3 IMPLANT
SUT ETHIBOND 0 MO6 C/R (SUTURE) ×3 IMPLANT
SUT ETHIBOND CT1 BRD #0 30IN (SUTURE) ×3 IMPLANT
SUT VIC AB 0 CT1 36 (SUTURE) ×3 IMPLANT
SUT VIC AB 2-0 CT1 27 (SUTURE) ×6
SUT VIC AB 2-0 CT1 TAPERPNT 27 (SUTURE) ×2 IMPLANT
SUT VIC AB 3-0 SH 27 (SUTURE) ×3
SUT VIC AB 3-0 SH 27X BRD (SUTURE) ×1 IMPLANT
SYR 20CC LL (SYRINGE) ×3 IMPLANT
SYRINGE 10CC LL (SYRINGE) ×3 IMPLANT
SYS INTERNAL BRACE KNEE (Miscellaneous) ×3 IMPLANT
SYSTEM INTERNAL BRACE KNEE (Miscellaneous) ×1 IMPLANT

## 2017-02-02 NOTE — Discharge Instructions (Addendum)
AMBULATORY SURGERY  DISCHARGE INSTRUCTIONS   1) The drugs that you were given will stay in your system until tomorrow so for the next 24 hours you should not:  A) Drive an automobile B) Make any legal decisions C) Drink any alcoholic beverage   2) You may resume regular meals tomorrow.  Today it is better to start with liquids and gradually work up to solid foods.  You may eat anything you prefer, but it is better to start with liquids, then soup and crackers, and gradually work up to solid foods.   3) Please notify your doctor immediately if you have any unusual bleeding, trouble breathing, redness and pain at the surgery site, drainage, fever, or pain not relieved by medication. 4)   5) Your post-operative visit with Dr.                                     is: Date:                        Time:    Please call to schedule your post-operative visit.  6) Additional Instructions:      Keep dressing dry and intact.  May shower after dressing changed on post-op day #4 (Saturday).  Cover staples with Band-Aids after drying off. Apply ice frequently to knee or use Polar Care. Take ibuprofen 800 mg TID with meals for 7-10 days, then as necessary. Take pain med as prescribed when needed.  May supplement with ES Tylenol if necessary. May weight-bear as tolerated so long as in brace locked in extension - use crutches as needed. Follow-up in 10-14 days or as scheduled.

## 2017-02-02 NOTE — Transfer of Care (Signed)
Immediate Anesthesia Transfer of Care Note  Patient: Willie Jones  Procedure(s) Performed: PATELLA TENDON REPAIR (Left Knee)  Patient Location: PACU  Anesthesia Type:General  Level of Consciousness: sedated and responds to stimulation  Airway & Oxygen Therapy: Patient Spontanous Breathing and Patient connected to face mask oxygen  Post-op Assessment: Report given to RN and Post -op Vital signs reviewed and stable  Post vital signs: Reviewed and stable  Last Vitals:  Vitals:   02/02/17 1530 02/02/17 1743  BP: 124/86 113/64  Pulse: 86 74  Resp: 15 15  Temp:  (!) 36 C  SpO2: 100% 100%    Last Pain:  Vitals:   02/02/17 1743  TempSrc:   PainSc: Asleep         Complications: No anesthetic complications

## 2017-02-02 NOTE — Anesthesia Preprocedure Evaluation (Signed)
Anesthesia Evaluation  Patient identified by MRN, date of birth, ID band Patient awake    Reviewed: Allergy & Precautions, NPO status , Patient's Chart, lab work & pertinent test results  History of Anesthesia Complications Negative for: history of anesthetic complications  Airway Mallampati: II  TM Distance: >3 FB Neck ROM: Full    Dental no notable dental hx.    Pulmonary sleep apnea , neg COPD,    breath sounds clear to auscultation- rhonchi (-) wheezing      Cardiovascular Exercise Tolerance: Good (-) hypertension(-) CAD, (-) Past MI and (-) Cardiac Stents  Rhythm:Regular Rate:Normal - Systolic murmurs and - Diastolic murmurs    Neuro/Psych negative neurological ROS  negative psych ROS   GI/Hepatic negative GI ROS, Neg liver ROS,   Endo/Other  negative endocrine ROSneg diabetes  Renal/GU negative Renal ROS     Musculoskeletal negative musculoskeletal ROS (+)   Abdominal (+) + obese,   Peds  Hematology negative hematology ROS (+)   Anesthesia Other Findings Past Medical History: No date: Acute prostatitis No date: Bronchitis No date: Cancer (HCC)     Comment:  skin(face) No date: Decreased libido No date: Insomnia, unspecified No date: Mixed hyperlipidemia No date: Other malaise and fatigue 08/12/2015: Sleep apnea No date: Thoracic or lumbosacral neuritis or radiculitis, unspecified   Reproductive/Obstetrics                             Anesthesia Physical Anesthesia Plan  ASA: II  Anesthesia Plan: General   Post-op Pain Management:  Regional for Post-op pain   Induction: Intravenous  PONV Risk Score and Plan: 1 and Dexamethasone and Ondansetron  Airway Management Planned: LMA  Additional Equipment:   Intra-op Plan:   Post-operative Plan:   Informed Consent: I have reviewed the patients History and Physical, chart, labs and discussed the procedure including the  risks, benefits and alternatives for the proposed anesthesia with the patient or authorized representative who has indicated his/her understanding and acceptance.   Dental advisory given  Plan Discussed with: CRNA and Anesthesiologist  Anesthesia Plan Comments:         Anesthesia Quick Evaluation

## 2017-02-02 NOTE — Anesthesia Procedure Notes (Signed)
Procedure Name: LMA Insertion Date/Time: 02/02/2017 4:00 PM Performed by: Jonna Clark, CRNA Pre-anesthesia Checklist: Patient identified, Patient being monitored, Timeout performed, Emergency Drugs available and Suction available Patient Re-evaluated:Patient Re-evaluated prior to induction Oxygen Delivery Method: Circle system utilized Preoxygenation: Pre-oxygenation with 100% oxygen Induction Type: IV induction Ventilation: Mask ventilation without difficulty LMA: LMA inserted LMA Size: 4.0 Tube type: Oral Number of attempts: 1 Placement Confirmation: positive ETCO2 and breath sounds checked- equal and bilateral Tube secured with: Tape Dental Injury: Teeth and Oropharynx as per pre-operative assessment

## 2017-02-02 NOTE — Op Note (Signed)
02/02/2017  5:14 PM  Patient:   Willie Jones  Pre-Op Diagnosis:   Acute patellar tendon rupture, left knee.  Post-Op Diagnosis:   Same.  Procedure:   Primary repair of acute patellar tendon rupture, left knee.  Surgeon:   Pascal Lux, MD  Assistant:   Cameron Proud, PA-C; Doristine Mango, PA-S  Anesthesia:   General LMA with a femoral nerve block placed preoperatively by the anesthesiologist  Findings:   As above.  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  He presented to the emergency room where x-rays and physical examination demonstrated the presence of an acute tear of his left patella tendon.  He was placed in the immobilizer and presents at this time for definitive management of this injury.  Procedure:   The patient underwent the placement of a femoral nerve block in the preoperative holding area before he was brought into the operating room and lain in the supine position. After adequate general laryngeal mask anesthesia was obtained, the patient's left lower extremity was prepped with ChloraPrep solution before being draped sterilely. Preoperative antibiotics were administered. A timeout was performed to verify the appropriate surgical site before the limb was exsanguinated with an Esmarch and the tourniquet inflated to 300 mmHg. An approximately 4-5 inch incision was made over the anterior aspect of the knee, centered over the patella. The incision was carried down through the subcutaneous tissues to expose the superficial retinaculum. This was split the length of the incision and the medial and lateral flaps elevated  sufficiently to expose the patellar tendon. The patellar tendon was noted to have primarily ruptured from the tibial tubercle, although there was some patellar tendon tissue attached distally that had torn more proximally. After examining the tear pattern, it was elected to proceed with a primary distal repair with subsequent overlapping of the distal tissue onto the patellar tendon repair construct. Two 2 mm fiber tapes were woven in a Krakw fashion through the patellar tendon tissue with both ends of each fiber tape exiting distally. Each set of the fiber tapes was then secured using Arthrex 4.75 mm SwiveLock anchors placed at the medial and lateral margins of the tibial tubercle with care taken to avoid overtensioning the patellar tendon. Each of the #2 FiberWire is contained within the anchor was then passed through the portion of the patellar tendon that was attached distally and tied securely. Numerous #0 Ethibond interrupted sutures were then used to reattach the distally based flap over the patellar tendon construct to reinforce the repair. In addition, several #0 Vicryl interrupted sutures were used to repair both the medial and lateral retinacular tears. Following this repair, the construct was stressed and found to be stable to gentle knee flexion to 90.  The wound was copiously irrigated with sterile saline solution before the superficial retinacular layer was reapproximated using 2-0 Vicryl interrupted sutures. The subcutaneous tissues were closed in two layers using 2-0 Vicryl interrupted sutures before the skin was closed using staples. A total of 20 cc of 0.5% Sensorcaine with epinephrine was injected in and  around the incision site to help with postoperative analgesia before a sterile occlusive dressing was applied to the knee. An Ace wrap was placed around the knee to accommodate a Polar Care pack before the patient was placed into a hinged knee brace with the hinges set at 0-70, but locked in  extension. The patient was then awakened, extubated, and returned to the recovery room in satisfactory condition after tolerating the procedure well.

## 2017-02-02 NOTE — Anesthesia Post-op Follow-up Note (Signed)
Anesthesia QCDR form completed.        

## 2017-02-02 NOTE — Anesthesia Procedure Notes (Signed)
Anesthesia Regional Block: Adductor canal block   Pre-Anesthetic Checklist: ,, timeout performed, Correct Patient, Correct Site, Correct Laterality, Correct Procedure, Correct Position, site marked, Risks and benefits discussed,  Surgical consent,  Pre-op evaluation,  At surgeon's request and post-op pain management  Laterality: Left  Prep: chloraprep       Needles:  Injection technique: Single-shot  Needle Type: Stimiplex     Needle Length: 10cm  Needle Gauge: 22     Additional Needles:   Procedures:,,,, ultrasound used (permanent image in chart),,,,  Narrative:  Start time: 02/02/2017 2:45 PM End time: 02/02/2017 2:51 PM Injection made incrementally with aspirations every 5 mL.  Performed by: Personally  Anesthesiologist: Emmie Niemann, MD  Additional Notes: Functioning IV was confirmed and monitors were applied.  A Stimuplex needle was used. Sterile prep and drape,hand hygiene and sterile gloves were used.  Negative aspiration and negative test dose prior to incremental administration of local anesthetic. The patient tolerated the procedure well.

## 2017-02-02 NOTE — H&P (Signed)
Paper H&P to be scanned into permanent record. H&P reviewed and patient re-examined. No changes. 

## 2017-02-03 ENCOUNTER — Encounter: Payer: Self-pay | Admitting: Surgery

## 2017-02-03 DIAGNOSIS — S86812A Strain of other muscle(s) and tendon(s) at lower leg level, left leg, initial encounter: Secondary | ICD-10-CM | POA: Insufficient documentation

## 2017-02-08 NOTE — Anesthesia Postprocedure Evaluation (Signed)
Anesthesia Post Note  Patient: Willie Jones  Procedure(s) Performed: PATELLA TENDON REPAIR (Left Knee)  Anesthesia Type: General     Last Vitals:  Vitals:   02/02/17 1857 02/02/17 1945  BP: 137/75 126/72  Pulse: 86 86  Resp: 16 16  Temp: (!) 36.3 C   SpO2: 99% 100%    Last Pain:  Vitals:   02/02/17 1945  TempSrc:   PainSc: 2                  Molli Barrows

## 2017-05-28 ENCOUNTER — Telehealth: Payer: Self-pay | Admitting: Family Medicine

## 2017-05-28 DIAGNOSIS — E782 Mixed hyperlipidemia: Secondary | ICD-10-CM

## 2017-05-28 NOTE — Telephone Encounter (Signed)
Copied from [place] [id number]. Topic: Quick Communication - Rx Refill/Question >> May 28, 2017  2:25 PM Ahmed Prima L wrote: Medication: VASCEPA 1 g CAPS  Has the patient contacted their pharmacy? Yes   (Agent: If no, request that the patient contact the pharmacy for the refill.)   Preferred Pharmacy (with phone number or street name): [facility] [phone] - [place], [address] [facility] [address]   Agent: Please be advised that RX refills may take up to 3 business days. We ask that you follow-up with your pharmacy.

## 2017-05-28 NOTE — Telephone Encounter (Signed)
Patient has not been since since 2017 He needs an appointment

## 2017-05-28 NOTE — Telephone Encounter (Signed)
Pt has no upcoming appt scheduled. Last rx written 05/20/16. Please advise

## 2017-05-29 ENCOUNTER — Other Ambulatory Visit: Payer: Self-pay | Admitting: Family Medicine

## 2017-05-31 NOTE — Telephone Encounter (Signed)
Called [phone] @ 9:23am Left voice message to schedule appointment.

## 2017-06-04 DIAGNOSIS — M76892 Other specified enthesopathies of left lower limb, excluding foot: Secondary | ICD-10-CM | POA: Insufficient documentation

## 2017-06-17 ENCOUNTER — Encounter: Payer: Self-pay | Admitting: Family Medicine

## 2017-06-17 ENCOUNTER — Ambulatory Visit (INDEPENDENT_AMBULATORY_CARE_PROVIDER_SITE_OTHER): Payer: 59 | Admitting: Family Medicine

## 2017-06-17 VITALS — BP 110/70 | HR 89 | Temp 97.4°F | Resp 16 | Ht 69.0 in | Wt 211.5 lb

## 2017-06-17 DIAGNOSIS — E781 Pure hyperglyceridemia: Secondary | ICD-10-CM | POA: Diagnosis not present

## 2017-06-17 DIAGNOSIS — G4733 Obstructive sleep apnea (adult) (pediatric): Secondary | ICD-10-CM | POA: Diagnosis not present

## 2017-06-17 DIAGNOSIS — B356 Tinea cruris: Secondary | ICD-10-CM | POA: Diagnosis not present

## 2017-06-17 DIAGNOSIS — E669 Obesity, unspecified: Secondary | ICD-10-CM | POA: Diagnosis not present

## 2017-06-17 DIAGNOSIS — S86812D Strain of other muscle(s) and tendon(s) at lower leg level, left leg, subsequent encounter: Secondary | ICD-10-CM | POA: Diagnosis not present

## 2017-06-17 MED ORDER — ICOSAPENT ETHYL 1 G PO CAPS: 2.0000 | ORAL_CAPSULE | Freq: Two times a day (BID) | ORAL | 11 refills | Status: DC

## 2017-06-17 MED ORDER — CLOTRIMAZOLE-BETAMETHASONE 1-0.05 % EX CREA: 1.0000 "application " | TOPICAL_CREAM | Freq: Two times a day (BID) | CUTANEOUS | 2 refills | Status: DC | PRN

## 2017-06-17 NOTE — Assessment & Plan Note (Signed)
Continuing therapy with PT

## 2017-06-17 NOTE — Patient Instructions (Addendum)
Check out the information at familydoctor.org entitled "Nutrition for Weight Loss: What You Need to Know about Fad Diets" Try to lose between 1-2 pounds per week by taking in fewer calories and burning off more calories You can succeed by limiting portions, limiting foods dense in calories and fat, becoming more active, and drinking 8 glasses of water a day (64 ounces) Don't skip meals, especially breakfast, as skipping meals may alter your metabolism Do not use over-the-counter weight loss pills or gimmicks that claim rapid weight loss A healthy BMI (or body mass index) is between 18.5 and 24.9 You can calculate your ideal BMI at the [employer] website ClubMonetize.fr Food Choices to Lower Your Triglycerides Triglycerides are a type of fat in your blood. High levels of triglycerides can increase the risk of heart disease and stroke. If your triglyceride levels are high, the foods you eat and your eating habits are very important. Choosing the right foods can help lower your triglycerides. What general guidelines do I need to follow?  Lose weight if you are overweight.  Limit or avoid alcohol.  Fill one half of your plate with vegetables and green salads.  Limit fruit to two servings a day. Choose fruit instead of juice.  Make one fourth of your plate whole grains. Look for the word "whole" as the first word in the ingredient list.  Fill one fourth of your plate with lean protein foods.  Enjoy fatty fish (such as salmon, mackerel, sardines, and tuna) three times a week.  Choose healthy fats.  Limit foods high in starch and sugar.  Eat more home-cooked food and less restaurant, buffet, and fast food.  Limit fried foods.  Cook foods using methods other than frying.  Limit saturated fats.  Check ingredient lists to avoid foods with partially hydrogenated oils (trans fats) in them. What foods can I eat? Grains Whole grains, such as  whole wheat or whole grain breads, crackers, cereals, and pasta. Unsweetened oatmeal, bulgur, barley, quinoa, or brown rice. Corn or whole wheat flour tortillas. Vegetables Fresh or frozen vegetables (raw, steamed, roasted, or grilled). Green salads. Fruits All fresh, canned (in natural juice), or frozen fruits. Meat and Other Protein Products Ground beef (85% or leaner), grass-fed beef, or beef trimmed of fat. Skinless chicken or [place]. Ground chicken or [place]. Pork trimmed of fat. All fish and seafood. Eggs. Dried beans, peas, or lentils. Unsalted nuts or seeds. Unsalted canned or dry beans. Dairy Low-fat dairy products, such as skim or 1% milk, 2% or reduced-fat cheeses, low-fat ricotta or cottage cheese, or plain low-fat yogurt. Fats and Oils Tub margarines without trans fats. Light or reduced-fat mayonnaise and salad dressings. Avocado. Safflower, olive, or canola oils. Natural peanut or almond butter. The items listed above may not be a complete list of recommended foods or beverages. Contact your dietitian for more options. What foods are not recommended? Grains White bread. White pasta. White rice. Cornbread. Bagels, pastries, and croissants. Crackers that contain trans fat. Vegetables White potatoes. Corn. Creamed or fried vegetables. Vegetables in a cheese sauce. Fruits Dried fruits. Canned fruit in light or heavy syrup. Fruit juice. Meat and Other Protein Products Fatty cuts of meat. Ribs, chicken wings, bacon, sausage, bologna, salami, chitterlings, fatback, hot dogs, bratwurst, and packaged luncheon meats. Dairy Whole or 2% milk, cream, half-and-half, and cream cheese. Whole-fat or sweetened yogurt. Full-fat cheeses. Nondairy creamers and whipped toppings. Processed cheese, cheese spreads, or cheese curds. Sweets and Desserts Corn syrup, sugars, honey, and molasses. Candy. Jam  and jelly. Syrup. Sweetened cereals. Cookies, pies, cakes, donuts, muffins, and ice cream. Fats and  Oils Butter, stick margarine, lard, shortening, ghee, or bacon fat. Coconut, palm kernel, or palm oils. Beverages Alcohol. Sweetened drinks (such as sodas, lemonade, and fruit drinks or punches). The items listed above may not be a complete list of foods and beverages to avoid. Contact your dietitian for more information. This information is not intended to replace advice given to you by your health care provider. Make sure you discuss any questions you have with your health care provider. Document Released: 12/26/2003 Document Revised: 08/15/2015 Document Reviewed: 01/11/2013 Elsevier Interactive Patient Education  2017 [employer].

## 2017-06-17 NOTE — Assessment & Plan Note (Signed)
Using CPAP 98% of the time; consistent use may help prevent heart failure down the road, I explained

## 2017-06-17 NOTE — Progress Notes (Signed)
BP 110/70   Pulse 89   Temp (!) 97.4 F (36.3 C) (Oral)   Resp 16   Ht 5\' 9"  (1.753 m)   Wt 211 lb 8 oz (95.9 kg)   SpO2 94%   BMI 31.23 kg/m    Subjective:    Patient ID: Willie Jones, male    DOB: June 10, 1970, 48 y.o.   MRN: [id number]  HPI: ROBERTSON COLCLOUGH is a 47 y.o. male  Chief Complaint  Patient presents with  . Follow-up    HPI  Patient is here for f/u February 02, 2017 was surgical repair of torn patellar tendon, LEFT; fiberoptic mesh Still doing PT 3x a week; has lost muscle mass and strength in the thigh Dr. Roland Rack  Blood pressure is excellent here; no hx of taking medicine; does run in the famiy  High TG; runs in the family; "everybody dies of heart attack"; men on father's side; PGF lived to be 41 and finally died from CHF; father just got out of the hospital, had fluid on his heart, 47 years old Typical diet recall: really variable; eats more fried foods than he should; consistent eater, had salad for lunch with chicken; "I have to have my meat"; grilled sometimes; caesar salad, with regular dressing; he does have fat free salad at home, but did have regular most recently; does like [place] dresing and asks for fat free when he thinks of it; stays away from dairy; hardly eating eggs; trying to exercise; last dose of Vascepa two weeks ago  Weight gain; since surgery; no fam hx of thyroid dz; "I do not eat like a pig"  OSA; using CPAP, 98% of the time, once in a while falls asleep without it but majority of the time uses  Rash in the groin associated with exercise and sweat; he would like a refill of medicine he used years ago  Depression screen [facility] 2/9 06/17/2017 08/12/2015  Decreased Interest 0 0  Down, Depressed, Hopeless 0 0  PHQ - 2 Score 0 0    Relevant past medical, surgical, family and social history reviewed Past Medical History:  Diagnosis Date  . Acute prostatitis   . Bronchitis   . Cancer (HCC)    skin(face)  . Decreased libido   .  Insomnia, unspecified   . Mixed hyperlipidemia   . Other malaise and fatigue   . Sleep apnea 08/12/2015  . Thoracic or lumbosacral neuritis or radiculitis, unspecified    Past Surgical History:  Procedure Laterality Date  . APPENDECTOMY    . HERNIA REPAIR    . PATELLAR TENDON REPAIR Left 02/02/2017   Procedure: PATELLA TENDON REPAIR;  Surgeon: Corky Mull, MD;  Location: [facility] ORS;  Service: Orthopedics;  Laterality: Left;   Family History  Problem Relation Age of Onset  . Heart disease Father   . Heart attack Father   . Hypertension Father   . Hyperlipidemia Father   . Hypertension Mother   . Hyperlipidemia Brother   . Heart disease Paternal Grandfather    Social History   Tobacco Use  . Smoking status: Never Smoker  . Smokeless tobacco: Never Used  Substance Use Topics  . Alcohol use: Yes    Comment: occasional  . Drug use: No    Interim medical history since last visit reviewed. Allergies and medications reviewed  Review of Systems Per HPI unless specifically indicated above     Objective:    BP 110/70   Pulse 89  Temp (!) 97.4 F (36.3 C) (Oral)   Resp 16   Ht 5\' 9"  (1.753 m)   Wt 211 lb 8 oz (95.9 kg)   SpO2 94%   BMI 31.23 kg/m   Wt Readings from Last 3 Encounters:  06/17/17 211 lb 8 oz (95.9 kg)  02/02/17 204 lb (92.5 kg)  02/01/17 204 lb (92.5 kg)    Physical Exam  Constitutional: He appears well-developed and well-nourished. No distress.  obese  HENT:  Head: Normocephalic and atraumatic.  Right Ear: External ear normal.  Left Ear: External ear normal.  Eyes: No scleral icterus.  Neck: No thyromegaly present.  Cardiovascular: Normal rate and regular rhythm.  Pulmonary/Chest: Effort normal and breath sounds normal.  Abdominal: He exhibits no distension.  Neurological: He is alert.  Reflex Scores:      Patellar reflexes are 2+ on the right side. Skin: No pallor.  No xanthelasma  Psychiatric: He has a normal mood and affect. His mood  appears not anxious. He does not exhibit a depressed mood.    Results for orders placed or performed in visit on 04/29/16  Lipid Profile  Result Value Ref Range   Cholesterol, Total 156 100 - 199 mg/dL   Triglycerides 146 0 - 149 mg/dL   HDL 38 (L) >39 mg/dL   VLDL Cholesterol Cal 29 5 - 40 mg/dL   LDL Calculated 89 0 - 99 mg/dL   Chol/HDL Ratio 4.1 0.0 - 5.0 ratio units      Assessment & Plan:   Problem List Items Addressed This Visit      Respiratory   Sleep apnea    Using CPAP 98% of the time; consistent use may help prevent heart failure down the road, I explained        Musculoskeletal and Integument   Rupture of left patellar tendon    Continuing therapy with PT        Other   Obesity (BMI 30.0-34.9)    Work on weight loss; check TSH; his surgery / injury really held him back      Relevant Orders   Basic metabolic panel   TSH   Hypertriglyceridemia - Primary    Check level today; truly fasting; limit fried foods, starchy and sweet stuff; runs in the family      Relevant Medications   Icosapent Ethyl (VASCEPA) 1 g CAPS   Other Relevant Orders   Lipid panel    Other Visit Diagnoses    Tinea cruris       refill for cream provided   Relevant Medications   clotrimazole-betamethasone (LOTRISONE) cream       Follow up plan: Return in about 1 year (around 06/18/2018) for twenty minute follow-up with fasting labs.  An after-visit summary was printed and given to the patient at [place].  Please see the patient instructions which may contain other information and recommendations beyond what is mentioned above in the assessment and plan.  Meds ordered this encounter  Medications  . Icosapent Ethyl (VASCEPA) 1 g CAPS    Sig: Take 2 capsules (2 g total) by mouth 2 (two) times daily.    Dispense:  120 capsule    Refill:  11  . clotrimazole-betamethasone (LOTRISONE) cream    Sig: Apply 1 application topically 2 (two) times daily as needed.    Dispense:  30 g     Refill:  2    Orders Placed This Encounter  Procedures  . Lipid panel  . Basic metabolic  panel  . TSH

## 2017-06-17 NOTE — Assessment & Plan Note (Signed)
Check level today; truly fasting; limit fried foods, starchy and sweet stuff; runs in the family

## 2017-06-17 NOTE — Assessment & Plan Note (Signed)
Work on weight loss; check TSH; his surgery / injury really held him back

## 2017-06-18 LAB — LIPID PANEL
CHOL/HDL RATIO: 5.4 (calc) — AB (ref <5.0)
CHOLESTEROL: 193 mg/dL (ref <200)
HDL: 36 mg/dL — ABNORMAL LOW (ref >40)
LDL CHOLESTEROL (CALC): 114 mg/dL — AB
Non-HDL Cholesterol (Calc): 157 mg/dL (calc) — ABNORMAL HIGH (ref <130)
Triglycerides: 323 mg/dL — ABNORMAL HIGH (ref <150)

## 2017-06-18 LAB — TSH: TSH: 1.64 m[IU]/L (ref 0.40–4.50)

## 2017-06-18 LAB — BASIC METABOLIC PANEL
BUN: 16 mg/dL (ref 7–25)
CALCIUM: 9.4 mg/dL (ref 8.6–10.3)
CO2: 28 mmol/L (ref 20–32)
CREATININE: 1.08 mg/dL (ref 0.60–1.35)
Chloride: 102 mmol/L (ref 98–110)
GLUCOSE: 88 mg/dL (ref 65–99)
POTASSIUM: 4 mmol/L (ref 3.5–5.3)
SODIUM: 139 mmol/L (ref 135–146)

## 2017-06-23 ENCOUNTER — Encounter: Payer: Self-pay | Admitting: Family Medicine

## 2017-07-19 DIAGNOSIS — G4730 Sleep apnea, unspecified: Secondary | ICD-10-CM | POA: Diagnosis not present

## 2017-09-15 DIAGNOSIS — G4733 Obstructive sleep apnea (adult) (pediatric): Secondary | ICD-10-CM | POA: Diagnosis not present

## 2017-10-28 ENCOUNTER — Encounter: Payer: Self-pay | Admitting: Family Medicine

## 2017-11-05 ENCOUNTER — Other Ambulatory Visit: Payer: Self-pay | Admitting: Otolaryngology

## 2017-11-05 DIAGNOSIS — H9191 Unspecified hearing loss, right ear: Secondary | ICD-10-CM

## 2017-11-05 DIAGNOSIS — H905 Unspecified sensorineural hearing loss: Secondary | ICD-10-CM | POA: Diagnosis not present

## 2017-11-05 DIAGNOSIS — H9319 Tinnitus, unspecified ear: Secondary | ICD-10-CM | POA: Diagnosis not present

## 2017-11-08 ENCOUNTER — Ambulatory Visit: Payer: 59 | Admitting: Family Medicine

## 2017-11-12 ENCOUNTER — Ambulatory Visit: Payer: 59 | Admitting: Family Medicine

## 2017-11-12 ENCOUNTER — Encounter: Payer: Self-pay | Admitting: Family Medicine

## 2017-11-12 VITALS — BP 122/72 | HR 89 | Temp 98.2°F | Resp 12 | Ht 69.0 in | Wt 213.0 lb

## 2017-11-12 DIAGNOSIS — E781 Pure hyperglyceridemia: Secondary | ICD-10-CM

## 2017-11-12 DIAGNOSIS — R0602 Shortness of breath: Secondary | ICD-10-CM

## 2017-11-12 DIAGNOSIS — S86812D Strain of other muscle(s) and tendon(s) at lower leg level, left leg, subsequent encounter: Secondary | ICD-10-CM

## 2017-11-12 DIAGNOSIS — R590 Localized enlarged lymph nodes: Secondary | ICD-10-CM

## 2017-11-12 DIAGNOSIS — M4726 Other spondylosis with radiculopathy, lumbar region: Secondary | ICD-10-CM | POA: Diagnosis not present

## 2017-11-12 DIAGNOSIS — R2240 Localized swelling, mass and lump, unspecified lower limb: Secondary | ICD-10-CM

## 2017-11-12 DIAGNOSIS — E782 Mixed hyperlipidemia: Secondary | ICD-10-CM

## 2017-11-12 DIAGNOSIS — M47816 Spondylosis without myelopathy or radiculopathy, lumbar region: Secondary | ICD-10-CM | POA: Diagnosis not present

## 2017-11-12 DIAGNOSIS — M7989 Other specified soft tissue disorders: Secondary | ICD-10-CM

## 2017-11-12 HISTORY — DX: Spondylosis without myelopathy or radiculopathy, lumbar region: M47.816

## 2017-11-12 LAB — D-DIMER, QUANTITATIVE: D-Dimer, Quant: 0.39 mcg/mL FEU (ref <0.50)

## 2017-11-12 NOTE — Patient Instructions (Signed)
We'll get labs today Please accept phone calls later today, perhaps from unlisted numbers, in regards to the D-dimer; if that is positive, we'll send you to the emergency department for a scan Have the chest xray done across the street Please call [phone] to schedule your imaging test (soft tissue ultrasounds of the groin / thighs) Please wait 2-3 days after the order has been placed to call and get your test scheduled  Follow-up in 2-3 weeks to see progress, how you're doing, continue further work-up if needed

## 2017-11-12 NOTE — Assessment & Plan Note (Signed)
Check lipids today 

## 2017-11-12 NOTE — Assessment & Plan Note (Signed)
Getting progressively better; turned loose by Dr. Roland Rack

## 2017-11-12 NOTE — Assessment & Plan Note (Signed)
With right sided radiculopathy

## 2017-11-12 NOTE — Assessment & Plan Note (Signed)
Noted on xray

## 2017-11-12 NOTE — Assessment & Plan Note (Signed)
On vascepa; check lipids today
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
## 2017-11-13 LAB — CBC WITH DIFFERENTIAL/PLATELET
BASOS ABS: 38 {cells}/uL (ref 0–200)
Basophils Relative: 0.5 %
EOS PCT: 0.7 %
Eosinophils Absolute: 53 cells/uL (ref 15–500)
HEMATOCRIT: 43 % (ref 38.5–50.0)
Hemoglobin: 14.9 g/dL (ref 13.2–17.1)
LYMPHS ABS: 2721 {cells}/uL (ref 850–3900)
MCH: 30.4 pg (ref 27.0–33.0)
MCHC: 34.7 g/dL (ref 32.0–36.0)
MCV: 87.8 fL (ref 80.0–100.0)
MPV: 12.1 fL (ref 7.5–12.5)
Monocytes Relative: 6.7 %
NEUTROS PCT: 56.3 %
Neutro Abs: 4279 cells/uL (ref 1500–7800)
PLATELETS: 241 10*3/uL (ref 140–400)
RBC: 4.9 10*6/uL (ref 4.20–5.80)
RDW: 13.1 % (ref 11.0–15.0)
Total Lymphocyte: 35.8 %
WBC mixed population: 509 cells/uL (ref 200–950)
WBC: 7.6 10*3/uL (ref 3.8–10.8)

## 2017-11-13 LAB — COMPLETE METABOLIC PANEL WITH GFR
AG RATIO: 1.9 (calc) (ref 1.0–2.5)
ALBUMIN MSPROF: 4.5 g/dL (ref 3.6–5.1)
ALKALINE PHOSPHATASE (APISO): 75 U/L (ref 40–115)
ALT: 14 U/L (ref 9–46)
AST: 22 U/L (ref 10–40)
BUN: 9 mg/dL (ref 7–25)
CO2: 26 mmol/L (ref 20–32)
Calcium: 9.2 mg/dL (ref 8.6–10.3)
Chloride: 104 mmol/L (ref 98–110)
Creat: 1.06 mg/dL (ref 0.60–1.35)
GFR, EST NON AFRICAN AMERICAN: 84 mL/min/{1.73_m2} (ref >=60)
GFR, Est African American: 97 mL/min/{1.73_m2} (ref >=60)
GLOBULIN: 2.4 g/dL (ref 1.9–3.7)
Glucose, Bld: 80 mg/dL (ref 65–99)
POTASSIUM: 4 mmol/L (ref 3.5–5.3)
SODIUM: 140 mmol/L (ref 135–146)
Total Bilirubin: 0.6 mg/dL (ref 0.2–1.2)
Total Protein: 6.9 g/dL (ref 6.1–8.1)

## 2017-11-13 LAB — LIPID PANEL
CHOLESTEROL: 175 mg/dL (ref <200)
HDL: 34 mg/dL — ABNORMAL LOW (ref >40)
LDL Cholesterol (Calc): 99 mg/dL (calc)
NON-HDL CHOLESTEROL (CALC): 141 mg/dL — AB (ref <130)
Total CHOL/HDL Ratio: 5.1 (calc) — ABNORMAL HIGH (ref <5.0)
Triglycerides: 312 mg/dL — ABNORMAL HIGH (ref <150)

## 2017-11-19 ENCOUNTER — Ambulatory Visit
Admission: RE | Admit: 2017-11-19 | Discharge: 2017-11-19 | Disposition: A | Discharge status: Home / Self Care | Payer: 59 | Source: Ambulatory Visit | Attending: Otolaryngology | Admitting: Otolaryngology

## 2017-11-19 DIAGNOSIS — H9191 Unspecified hearing loss, right ear: Secondary | ICD-10-CM | POA: Diagnosis present

## 2017-11-19 DIAGNOSIS — H9319 Tinnitus, unspecified ear: Secondary | ICD-10-CM | POA: Diagnosis not present

## 2017-11-19 MED ORDER — GADOBENATE DIMEGLUMINE 529 MG/ML IV SOLN
20.0000 mL | Freq: Once | INTRAVENOUS | Status: AC | PRN
  Administered 2017-11-19: 20 mL via INTRAVENOUS

## 2017-11-24 ENCOUNTER — Encounter: Payer: 59 | Admitting: Nurse Practitioner

## 2017-11-24 ENCOUNTER — Encounter: Payer: Self-pay | Admitting: Nurse Practitioner

## 2017-11-24 NOTE — Progress Notes (Signed)
Error

## 2017-11-25 ENCOUNTER — Ambulatory Visit
Admission: RE | Admit: 2017-11-25 | Discharge: 2017-11-25 | Disposition: A | Discharge status: Home / Self Care | Payer: 59 | Source: Ambulatory Visit | Attending: Family Medicine | Admitting: Family Medicine

## 2017-11-25 DIAGNOSIS — R590 Localized enlarged lymph nodes: Secondary | ICD-10-CM

## 2017-11-25 DIAGNOSIS — R2240 Localized swelling, mass and lump, unspecified lower limb: Secondary | ICD-10-CM | POA: Diagnosis present

## 2017-11-25 DIAGNOSIS — R1030 Lower abdominal pain, unspecified: Secondary | ICD-10-CM | POA: Diagnosis not present

## 2017-11-25 DIAGNOSIS — M7989 Other specified soft tissue disorders: Secondary | ICD-10-CM

## 2017-11-30 ENCOUNTER — Ambulatory Visit: Payer: 59 | Admitting: Nurse Practitioner

## 2017-11-30 ENCOUNTER — Encounter: Payer: Self-pay | Admitting: Nurse Practitioner

## 2017-11-30 VITALS — BP 126/80 | HR 90 | Temp 98.3°F | Resp 16 | Ht 69.0 in | Wt 211.6 lb

## 2017-11-30 DIAGNOSIS — R0602 Shortness of breath: Secondary | ICD-10-CM | POA: Diagnosis not present

## 2017-11-30 DIAGNOSIS — D1724 Benign lipomatous neoplasm of skin and subcutaneous tissue of left leg: Secondary | ICD-10-CM | POA: Diagnosis not present

## 2017-11-30 DIAGNOSIS — D1723 Benign lipomatous neoplasm of skin and subcutaneous tissue of right leg: Secondary | ICD-10-CM

## 2017-11-30 DIAGNOSIS — F411 Generalized anxiety disorder: Secondary | ICD-10-CM

## 2017-11-30 DIAGNOSIS — M549 Dorsalgia, unspecified: Secondary | ICD-10-CM

## 2017-11-30 DIAGNOSIS — M5430 Sciatica, unspecified side: Secondary | ICD-10-CM

## 2017-11-30 MED ORDER — DULOXETINE HCL 30 MG PO CPEP: ORAL_CAPSULE | ORAL | 0 refills | Status: DC

## 2017-11-30 NOTE — Progress Notes (Signed)
Name: Willie Jones   MRN: [id number]    DOB: 31-Oct-1970   Date:11/30/2017       Progress Note  Subjective  Chief Complaint  Chief Complaint  Patient presents with  . Back Pain    HPI  Patient presents for follow-up of shortness of breath, hearing loss of right ear and lumps at bilateral groin.  Saw PCP for this on 8/23 at that time had noted 3 months of intermittent shortness of breath seems to happen randomly,  some trouble catching a deep breath at rest.  Negative D-Dimer. Chest x-ray ordered but not completed yet. Has a lot of stress and a lot going on right now; thinks it is related to anxiety. States these episodes are happening on a daily basis. No chest pain  GAD 7 : Generalized Anxiety Score 11/30/2017  Nervous, Anxious, on Edge 2  Control/stop worrying 2  Worry too much - different things 3  Trouble relaxing 3  Restless 3  Easily annoyed or irritable 2  Afraid - awful might happen 0  Total GAD 7 Score 15  Anxiety Difficulty Very difficult     Had ultrasound of bilateral lower extremities- shows bilateral lipoma.   Sees Dr. Richardson Landry for right ear numbness. MRI head completed showing mild mucosal edema of paranasal sinuses but no cause for hearing loss was identified.    Patient Active Problem List   Diagnosis Date Noted  . Facet arthropathy, lumbar 11/12/2017  . Degenerative arthritis of lumbar spine 11/12/2017  . Obesity (BMI 30.0-34.9) 06/17/2017  . Adhesive capsulitis of left knee 06/04/2017  . Rupture of left patellar tendon 02/03/2017  . Hypertriglyceridemia 03/25/2016  . Preventative health care 08/12/2015  . Sleep apnea 08/12/2015  . Urinary frequency 08/12/2015  . Nocturia 08/12/2015  . Decreased libido 08/12/2015  . Atypical chest pain 11/02/2013  . Mixed hyperlipidemia     Past Medical History:  Diagnosis Date  . Acute prostatitis   . Bronchitis   . Cancer (HCC)    skin(face)  . Decreased libido   . Degenerative arthritis of lumbar spine  11/12/2017   Noted on xray 2007  . Facet arthropathy, lumbar 11/12/2017   Noted xrays 2007  . Insomnia, unspecified   . Mixed hyperlipidemia   . Other malaise and fatigue   . Sleep apnea 08/12/2015  . Thoracic or lumbosacral neuritis or radiculitis, unspecified     Past Surgical History:  Procedure Laterality Date  . APPENDECTOMY    . HERNIA REPAIR    . PATELLAR TENDON REPAIR Left 02/02/2017   Procedure: PATELLA TENDON REPAIR;  Surgeon: Corky Mull, MD;  Location: [facility] ORS;  Service: Orthopedics;  Laterality: Left;    Social History   Tobacco Use  . Smoking status: Never Smoker  . Smokeless tobacco: Never Used  Substance Use Topics  . Alcohol use: Yes    Comment: occasional     Current Outpatient Medications:  .  aspirin EC 81 MG tablet, Take 81 mg daily by mouth., Disp: , Rfl:  .  Icosapent Ethyl (VASCEPA) 1 g CAPS, Take 2 capsules (2 g total) by mouth 2 (two) times daily., Disp: 120 capsule, Rfl: 11  No Known Allergies  ROS   No other specific complaints in a complete review of systems (except as listed in HPI above).  Objective  Vitals:   11/30/17 1558  BP: 126/80  Pulse: 90  Resp: 16  Temp: 98.3 F (36.8 C)  TempSrc: Oral  SpO2: 96%  Weight: 211  lb 9.6 oz (96 kg)  Height: 5\' 9"  (1.753 m)    Body mass index is 31.25 kg/m.  Nursing Note and Vital Signs reviewed.  Physical Exam  Constitutional: He is oriented to person, place, and time. He appears well-developed and well-nourished.  HENT:  Head: Normocephalic and atraumatic.  Cardiovascular: Normal rate and regular rhythm.  Pulmonary/Chest: Effort normal and breath sounds normal.  Musculoskeletal: Normal range of motion.  Neurological: He is alert and oriented to person, place, and time.  Skin: Skin is warm and dry.  Psychiatric: He has a normal mood and affect. His behavior is normal. Judgment and thought content normal.     No results found for this or any previous visit (from the past 48  hour(s)).  Assessment & Plan  1. GAD (generalized anxiety disorder) Follow-up in 4-6 weeks.  - DULoxetine (CYMBALTA) 30 MG capsule; I tab a day for 2 weeks, then 2 tablets a day after that.  Dispense: 70 capsule; Refill: 0  2. Shortness of breath - negative d-dimer, chest x-ray ordered, wearing cpap, discussed logging when it occurs, precipiating factors, if worsening, or accompanied with chest pain go to ER, in non-emergency come to be evaluated sooner.   3. Lipoma of left lower extremity   4. Lipoma of right lower extremity   5. Back pain with sciatica Rest, stretching,  - DULoxetine (CYMBALTA) 30 MG capsule; I tab a day for 2 weeks, then 2 tablets a day after that.  Dispense: 70 capsule; Refill: 0

## 2018-01-04 ENCOUNTER — Encounter: Payer: Self-pay | Admitting: Nurse Practitioner

## 2018-01-04 ENCOUNTER — Ambulatory Visit: Payer: 59 | Admitting: Nurse Practitioner

## 2018-01-04 VITALS — BP 130/74 | HR 99 | Temp 98.0°F | Resp 12 | Ht 69.0 in | Wt 206.3 lb

## 2018-01-04 DIAGNOSIS — M549 Dorsalgia, unspecified: Secondary | ICD-10-CM | POA: Diagnosis not present

## 2018-01-04 DIAGNOSIS — R0602 Shortness of breath: Secondary | ICD-10-CM | POA: Diagnosis not present

## 2018-01-04 DIAGNOSIS — M5430 Sciatica, unspecified side: Secondary | ICD-10-CM

## 2018-01-04 DIAGNOSIS — F411 Generalized anxiety disorder: Secondary | ICD-10-CM | POA: Diagnosis not present

## 2018-01-04 MED ORDER — DULOXETINE HCL 30 MG PO CPEP: ORAL_CAPSULE | ORAL | 1 refills | Status: DC

## 2018-01-04 NOTE — Patient Instructions (Addendum)
-   Consider counseling

## 2018-01-04 NOTE — Progress Notes (Signed)
Name: Willie Jones   MRN: [id number]    DOB: 11/02/1970   Date:01/04/2018       Progress Note  Subjective  Chief Complaint  Chief Complaint  Patient presents with  . Follow-up    pt states new med cymblta helped with anxiety and sob but made him tired  . Medication Refill    HPI  States since being on cymbalta has been much improved- states daily episodes of shortness of breath have gone from daily to 5 times in a month. Wife has noticed improvement in mood and ability to handle difficult situations.  Patient notes that he feels much more calm and decreased pain in knee, has not improved lower back sciatic pain however.  He does use stretching and heat.  Sees physical therapy for his knee.  He does note on this medication that he is having difficulty staying awake during the day if he is not busy.  Is working on loosing weight- portion control, increasing activity recently started to run again, was a little discouraged because his endurance has decreased due to knee issue, but is working on improving that.   Wt Readings from Last 3 Encounters:  01/04/18 206 lb 4.8 oz (93.6 kg)  11/30/17 211 lb 9.6 oz (96 kg)  11/24/17 210 lb (95.3 kg)      Office Visit from 01/04/2018 in [facility]  PHQ-9 Total Score  0       Patient Active Problem List   Diagnosis Date Noted  . Facet arthropathy, lumbar 11/12/2017  . Degenerative arthritis of lumbar spine 11/12/2017  . Obesity (BMI 30.0-34.9) 06/17/2017  . Adhesive capsulitis of left knee 06/04/2017  . Rupture of left patellar tendon 02/03/2017  . Hypertriglyceridemia 03/25/2016  . Preventative health care 08/12/2015  . Sleep apnea 08/12/2015  . Urinary frequency 08/12/2015  . Nocturia 08/12/2015  . Decreased libido 08/12/2015  . Atypical chest pain 11/02/2013  . Mixed hyperlipidemia     Past Medical History:  Diagnosis Date  . Acute prostatitis   . Bronchitis   . Cancer (HCC)    skin(face)  . Decreased  libido   . Degenerative arthritis of lumbar spine 11/12/2017   Noted on xray 2007  . Facet arthropathy, lumbar 11/12/2017   Noted xrays 2007  . Insomnia, unspecified   . Mixed hyperlipidemia   . Other malaise and fatigue   . Sleep apnea 08/12/2015  . Thoracic or lumbosacral neuritis or radiculitis, unspecified     Past Surgical History:  Procedure Laterality Date  . APPENDECTOMY    . HERNIA REPAIR    . PATELLAR TENDON REPAIR Left 02/02/2017   Procedure: PATELLA TENDON REPAIR;  Surgeon: Corky Mull, MD;  Location: [facility] ORS;  Service: Orthopedics;  Laterality: Left;    Social History   Tobacco Use  . Smoking status: Never Smoker  . Smokeless tobacco: Never Used  Substance Use Topics  . Alcohol use: Yes    Comment: occasional     Current Outpatient Medications:  .  DULoxetine (CYMBALTA) 30 MG capsule, I tab a day for 2 weeks, then 2 tablets a day after that. (Patient taking differently: Take 30 mg by mouth 2 (two) times daily. I tab a day for 2 weeks, then 2 tablets a day after that.), Disp: 70 capsule, Rfl: 0 .  Icosapent Ethyl (VASCEPA) 1 g CAPS, Take 2 capsules (2 g total) by mouth 2 (two) times daily., Disp: 120 capsule, Rfl: 11 .  aspirin EC 81  MG tablet, Take 81 mg daily by mouth., Disp: , Rfl:   No Known Allergies  ROS  No other specific complaints in a complete review of systems (except as listed in HPI above).  Objective  Vitals:   01/04/18 1612  BP: 130/74  Pulse: 99  Resp: 12  Temp: 98 F (36.7 C)  TempSrc: Oral  SpO2: 97%  Weight: 206 lb 4.8 oz (93.6 kg)  Height: 5\' 9"  (1.753 m)   Body mass index is 30.47 kg/m.  Nursing Note and Vital Signs reviewed.  Physical Exam  Constitutional: He is oriented to person, place, and time. He appears well-developed and well-nourished.  Cardiovascular: Normal rate and regular rhythm.  Pulmonary/Chest: Effort normal and breath sounds normal. No respiratory distress.  Neurological: He is alert and oriented to  person, place, and time.  Psychiatric: He has a normal mood and affect. His behavior is normal. Judgment and thought content normal.       No results found for this or any previous visit (from the past 48 hour(s)).  Assessment & Plan  1. GAD (generalized anxiety disorder) Significant improvement in patient mood and quality of life, plan to continue medication.  Discussed with patient importance of identifying possible causes of anxiety through counseling.  Patient gets this free through work, states will consider taking advantage of this. - DULoxetine (CYMBALTA) 30 MG capsule; 2 tablets a day  Dispense: 180 capsule; Refill: 1  2. Back pain with sciatica Uses stretching and heat.  Medication has minimal improvement of symptoms - DULoxetine (CYMBALTA) 30 MG capsule; 2 tablets a day  Dispense: 180 capsule; Refill: 1  3. Shortness of breath Likely caused by anxiety as this has significantly improved with the use of Cymbalta from daily episodes sometimes multiple times in the day to just 5 episodes within the last month.  Will continue Cymbalta, discussed counseling

## 2018-01-18 DIAGNOSIS — G4733 Obstructive sleep apnea (adult) (pediatric): Secondary | ICD-10-CM | POA: Diagnosis not present

## 2018-04-12 DIAGNOSIS — Z0189 Encounter for other specified special examinations: Secondary | ICD-10-CM | POA: Diagnosis not present

## 2018-04-12 DIAGNOSIS — E781 Pure hyperglyceridemia: Secondary | ICD-10-CM | POA: Diagnosis not present

## 2018-04-18 ENCOUNTER — Ambulatory Visit: Payer: 59 | Admitting: Nurse Practitioner

## 2018-04-18 ENCOUNTER — Encounter: Payer: Self-pay | Admitting: Nurse Practitioner

## 2018-04-18 VITALS — BP 118/72 | HR 88 | Temp 98.7°F | Resp 16 | Ht 69.0 in | Wt 221.6 lb

## 2018-04-18 DIAGNOSIS — J101 Influenza due to other identified influenza virus with other respiratory manifestations: Secondary | ICD-10-CM | POA: Diagnosis not present

## 2018-04-18 DIAGNOSIS — R52 Pain, unspecified: Secondary | ICD-10-CM | POA: Diagnosis not present

## 2018-04-18 DIAGNOSIS — R6883 Chills (without fever): Secondary | ICD-10-CM

## 2018-04-18 DIAGNOSIS — R059 Cough, unspecified: Secondary | ICD-10-CM

## 2018-04-18 DIAGNOSIS — R05 Cough: Secondary | ICD-10-CM | POA: Diagnosis not present

## 2018-04-18 DIAGNOSIS — J029 Acute pharyngitis, unspecified: Secondary | ICD-10-CM | POA: Diagnosis not present

## 2018-04-18 LAB — POCT INFLUENZA A/B
Influenza A, POC: POSITIVE — AB
Influenza B, POC: NEGATIVE

## 2018-04-18 LAB — POCT RAPID STREP A (OFFICE): Rapid Strep A Screen: NEGATIVE

## 2018-04-18 MED ORDER — BENZONATATE 100 MG PO CAPS: 200.0000 mg | ORAL_CAPSULE | Freq: Two times a day (BID) | ORAL | 0 refills | Status: DC | PRN

## 2018-04-18 MED ORDER — BALOXAVIR MARBOXIL(80 MG DOSE) 2 X 40 MG PO TBPK: 80.0000 mg | ORAL_TABLET | Freq: Once | ORAL | 0 refills | Status: AC

## 2018-04-18 MED ORDER — PROMETHAZINE-DM 6.25-15 MG/5ML PO SYRP: 2.5000 mL | ORAL_SOLUTION | Freq: Four times a day (QID) | ORAL | 0 refills | Status: DC | PRN

## 2018-04-18 NOTE — Progress Notes (Signed)
Name: Willie Jones   MRN: [id number]    DOB: Apr 30, 1970   Date:04/18/2018       Progress Note  Subjective  Chief Complaint  Chief Complaint  Patient presents with  . Influenza    patient presents with sx since sat. wife was dx with strep. otc Nyquil & Aleve  . Cough  . Fever  . Chills    HPI  Saturday night states noted coughing became very strong then had body aches, chills and low-grade fever. Took nyquil with some relief. States he stayed in bed yesterday, took ibuprofen with some relief. 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 His wife has strep and he did drink after her last week.   Denies nausea, vomiting, diarrhea   Patient Active Problem List   Diagnosis Date Noted  . Facet arthropathy, lumbar 11/12/2017  . Degenerative arthritis of lumbar spine 11/12/2017  . Obesity (BMI 30.0-34.9) 06/17/2017  . Adhesive capsulitis of left knee 06/04/2017  . Rupture of left patellar tendon 02/03/2017  . Hypertriglyceridemia 03/25/2016  . Preventative health care 08/12/2015  . Sleep apnea 08/12/2015  . Urinary frequency 08/12/2015  . Nocturia 08/12/2015  . Decreased libido 08/12/2015  . Atypical chest pain 11/02/2013  . Mixed hyperlipidemia     Past Medical History:  Diagnosis Date  . Acute prostatitis   . Bronchitis   . Cancer (HCC)    skin(face)  . Decreased libido   . Degenerative arthritis of lumbar spine 11/12/2017   Noted on xray 2007  . Facet arthropathy, lumbar 11/12/2017   Noted xrays 2007  . Insomnia, unspecified   . Mixed hyperlipidemia   . Other malaise and fatigue   . Sleep apnea 08/12/2015  . Thoracic or lumbosacral neuritis or radiculitis, unspecified     Past Surgical History:  Procedure Laterality Date  . APPENDECTOMY    . HERNIA REPAIR    . PATELLAR TENDON REPAIR Left 02/02/2017   Procedure: PATELLA TENDON REPAIR;  Surgeon: Corky Mull, MD;  Location: [facility] ORS;  Service: Orthopedics;  Laterality: Left;    Social History   Tobacco  Use  . Smoking status: Never Smoker  . Smokeless tobacco: Never Used  Substance Use Topics  . Alcohol use: Yes    Comment: occasional     Current Outpatient Medications:  .  DULoxetine (CYMBALTA) 30 MG capsule, 2 tablets a day, Disp: 180 capsule, Rfl: 1 .  Icosapent Ethyl (VASCEPA) 1 g CAPS, Take 2 capsules (2 g total) by mouth 2 (two) times daily., Disp: 120 capsule, Rfl: 11 .  aspirin EC 81 MG tablet, Take 81 mg daily by mouth., Disp: , Rfl:   No Known Allergies  ROS  No other specific complaints in a complete review of systems (except as listed in HPI above).  Objective  Vitals:   04/18/18 1156  BP: 118/72  Pulse: 88  Resp: 16  Temp: 98.7 F (37.1 C)  TempSrc: Oral  SpO2: 94%  Weight: 221 lb 9.6 oz (100.5 kg)  Height: 5\' 9"  (1.753 m)    Body mass index is 32.72 kg/m.  Nursing Note and Vital Signs reviewed.  Physical Exam HENT:     Head: Normocephalic and atraumatic.     Right Ear: Hearing, tympanic membrane, ear canal and external ear normal.     Left Ear: Hearing, tympanic membrane, ear canal and external ear normal.     Nose: Nose normal.     Right Sinus: No maxillary sinus tenderness or frontal sinus  tenderness.     Left Sinus: No maxillary sinus tenderness or frontal sinus tenderness.     Mouth/Throat:     Mouth: Mucous membranes are moist.     Pharynx: Uvula midline. Posterior oropharyngeal erythema present. No oropharyngeal exudate.  Eyes:     General:        Right eye: No discharge.        Left eye: No discharge.     Conjunctiva/sclera: Conjunctivae normal.  Neck:     Musculoskeletal: Normal range of motion.  Cardiovascular:     Rate and Rhythm: Normal rate.  Pulmonary:     Effort: Pulmonary effort is normal.     Breath sounds: Normal breath sounds.  Lymphadenopathy:     Cervical: No cervical adenopathy.  Skin:    General: Skin is warm and dry.     Findings: No rash.  Neurological:     Mental Status: He is alert.  Psychiatric:         Judgment: Judgment normal.     Results for orders placed or performed in visit on 04/18/18 (from the past 48 hour(s))  POCT Influenza A/B     Status: Abnormal   Collection Time: 04/18/18 11:56 AM  Result Value Ref Range   Influenza A, POC Positive (A) Negative   Influenza B, POC Negative Negative    Assessment & Plan  1. Chills Alternate between tylenol and ibuprofen, fluids, rest see AVS - POCT Influenza A/B - Baloxavir Marboxil,80 MG Dose, (XOFLUZA) 2 x 40 MG TBPK; Take 80 mg by mouth once for 1 dose.  Dispense: 1 each; Refill: 0  2. Body aches - POCT Influenza A/B - Baloxavir Marboxil,80 MG Dose, (XOFLUZA) 2 x 40 MG TBPK; Take 80 mg by mouth once for 1 dose.  Dispense: 1 each; Refill: 0  3. Cough Discussed taking syrup if not covered by insurance will switch to perles - POCT Influenza A/B - Baloxavir Marboxil,80 MG Dose, (XOFLUZA) 2 x 40 MG TBPK; Take 80 mg by mouth once for 1 dose.  Dispense: 1 each; Refill: 0 - promethazine-dextromethorphan (PROMETHAZINE-DM) 6.25-15 MG/5ML syrup; Take 2.5 mLs by mouth 4 (four) times daily as needed.  Dispense: 118 mL; Refill: 0 - benzonatate (TESSALON PERLES) 100 MG capsule; Take 2 capsules (200 mg total) by mouth 2 (two) times daily as needed for cough.  Dispense: 30 capsule; Refill: 0  4. Influenza A Discussed risks and benefits  - Baloxavir Marboxil,80 MG Dose, (XOFLUZA) 2 x 40 MG TBPK; Take 80 mg by mouth once for 1 dose.  Dispense: 1 each; Refill: 0  5. Sore throat Negative  - POCT rapid strep A    -Red flags and when to present for emergency care or RTC including fever >101.56F, chest pain, shortness of breath, new/worsening/un-resolving symptoms,  reviewed with patient at time of visit. Follow up and care instructions discussed and provided in AVS.

## 2018-04-18 NOTE — Patient Instructions (Addendum)
Influenza, Adult Influenza is also called "the flu." It is an infection in the lungs, nose, and throat (respiratory tract). It is caused by a virus. The flu causes symptoms that are similar to symptoms of a cold. It also causes a high fever and body aches. The flu spreads easily from person to person (is contagious). Getting a flu shot (influenza vaccination) every year is the best way to prevent the flu. What are the causes? This condition is caused by the influenza virus. You can get the virus by:  Breathing in droplets that are in the air from the cough or sneeze of a person who has the virus.  Touching something that has the virus on it (is contaminated) and then touching your mouth, nose, or eyes. What increases the risk? Certain things may make you more likely to get the flu. These include:  Not washing your hands often.  Having close contact with many people during cold and flu season.  Touching your mouth, eyes, or nose without first washing your hands.  Not getting a flu shot every year. You may have a higher risk for the flu, along with serious problems such as a lung infection (pneumonia), if you:  Are older than 65.  Are pregnant.  Have a weakened disease-fighting system (immune system) because of a disease or taking certain medicines.  Have a long-term (chronic) illness, such as: ? Heart, kidney, or lung disease. ? Diabetes. ? Asthma.  Have a liver disorder.  Are very overweight (morbidly obese).  Have anemia. This is a condition that affects your red blood cells. What are the signs or symptoms? Symptoms usually begin suddenly and last 4-14 days. They may include:  Fever and chills.  Headaches, body aches, or muscle aches.  Sore throat.  Cough.  Runny or stuffy (congested) nose.  Chest discomfort.  Not wanting to eat as much as normal (poor appetite).  Weakness or feeling tired (fatigue).  Dizziness.  Feeling sick to your stomach (nauseous) or  throwing up (vomiting). How is this treated? If the flu is found early, you can be treated with medicine that can help reduce how bad the illness is and how long it lasts (antiviral medicine). This may be given by mouth (orally) or through an IV tube. Taking care of yourself at home can help your symptoms get better. Your doctor may suggest:  Taking over-the-counter medicines.  Drinking plenty of fluids. The flu often goes away on its own. If you have very bad symptoms or other problems, you may be treated in a hospital. Follow these instructions at home: - Take xofluza dose today and avoid dairy products, calcium-fortified beverages, laxatives, antacids or oral supplements containing iron, zinc, selenium, calcium or magnesium for at least 48 hours. - Please rest, drink plenty of water to keep your urine clear/pale yellow and eat nutritious foods.  -Please cover your mouth when you cough and wash your hands frequently to prevent spread.  - For cough use cough syrup sent, if unavailable can pick up perls.  For Fever/Pain: Acetaminophen every 6 hours as needed (maximum of 3000mg  a day). If you are still uncomfortable you can add ibuprofen OR naproxen  For coughing: try dextromethorphan for a cough suppressant, and/or a cool mist humidifier, lozenges  For sore throat: saline gargles, honey herbal tea, lozenges, throat spray  To dry out your nose: try an antihistamine like loratadine (non-sedating) or diphenhydramine (sedating) or others To relieve a stuffy nose: try an oral decongestant  Like pseudoephedrine  if you are under the age of 1 and do not have high blood pressure, neti pot To make blowing your nose easier and relieve chest congestion: guaifenesin 400mg  every 4-6 hours of guaifenesin ER [phone] mg every 12 hours. Do not take more than 2,400mg  a day.     Activity  Rest as needed. Get plenty of sleep.  Stay home from work or school as told by your doctor. ? Do not leave home until  you do not have a fever for 24 hours without taking medicine. ? Leave home only to visit your doctor. Eating and drinking  Take an ORS (oral rehydration solution). This is a drink that is sold at pharmacies and stores.  Drink enough fluid to keep your pee (urine) pale yellow.  Drink clear fluids in small amounts as you are able. Clear fluids include: ? Water. ? Ice chips. ? Fruit juice that has water added (diluted fruit juice). ? Low-calorie sports drinks.  Eat bland, easy-to-digest foods in small amounts as you are able. These foods include: ? Bananas. ? Applesauce. ? Rice. ? Lean meats. ? Toast. ? Crackers.  Do not eat or drink: ? Fluids that have a lot of sugar or caffeine. ? Alcohol. ? Spicy or fatty foods. General instructions  Take over-the-counter and prescription medicines only as told by your doctor.  Use a cool mist humidifier to add moisture to the air in your home. This can make it easier for you to breathe.  Cover your mouth and nose when you cough or sneeze.  Wash your hands with soap and water often, especially after you cough or sneeze. If you cannot use soap and water, use alcohol-based hand sanitizer.  Keep all follow-up visits as told by your doctor. This is important. How is this prevented?   Get a flu shot every year. You may get the flu shot in late summer, fall, or winter. Ask your doctor when you should get your flu shot.  Avoid contact with people who are sick during fall and winter (cold and flu season). Contact a doctor if:  You get new symptoms.  You have: ? Chest pain. ? Watery poop (diarrhea). ? A fever.  Your cough gets worse.  You start to have more mucus.  You feel sick to your stomach.  You throw up. Get help right away if you:  Have shortness of breath.  Have trouble breathing.  Have skin or nails that turn a bluish color.  Have very bad pain or stiffness in your neck.  Get a sudden headache.  Get sudden pain  in your face or ear.  Cannot eat or drink without throwing up. Summary  Influenza ("the flu") is an infection in the lungs, nose, and throat. It is caused by a virus.  Take over-the-counter and prescription medicines only as told by your doctor.  Getting a flu shot every year is the best way to avoid getting the flu. This information is not intended to replace advice given to you by your health care provider. Make sure you discuss any questions you have with your health care provider. Document Released: 12/17/2007 Document Revised: 08/25/2017 Document Reviewed: 08/25/2017 Elsevier Interactive Patient Education  2019 [employer].

## 2018-04-29 DIAGNOSIS — G4733 Obstructive sleep apnea (adult) (pediatric): Secondary | ICD-10-CM | POA: Diagnosis not present

## 2018-06-12 ENCOUNTER — Other Ambulatory Visit: Payer: Self-pay | Admitting: Nurse Practitioner

## 2018-06-12 DIAGNOSIS — F411 Generalized anxiety disorder: Secondary | ICD-10-CM

## 2018-06-12 DIAGNOSIS — M5430 Sciatica, unspecified side: Secondary | ICD-10-CM

## 2018-06-12 DIAGNOSIS — M549 Dorsalgia, unspecified: Secondary | ICD-10-CM

## 2018-06-20 ENCOUNTER — Encounter: Payer: Self-pay | Admitting: Nurse Practitioner

## 2018-06-20 ENCOUNTER — Ambulatory Visit (INDEPENDENT_AMBULATORY_CARE_PROVIDER_SITE_OTHER): Payer: 59 | Admitting: Nurse Practitioner

## 2018-06-20 ENCOUNTER — Ambulatory Visit: Payer: 59 | Admitting: Family Medicine

## 2018-06-20 DIAGNOSIS — Z7189 Other specified counseling: Secondary | ICD-10-CM

## 2018-06-20 DIAGNOSIS — F411 Generalized anxiety disorder: Secondary | ICD-10-CM | POA: Diagnosis not present

## 2018-06-20 NOTE — Progress Notes (Signed)
Virtual Visit via Telephone Note  I connected with Willie Jones on 06/20/18 at  8:20 AM EDT by telephone and verified that I am speaking with the correct person using two identifiers.   I discussed the limitations, risks, security and privacy concerns of performing an evaluation and management service by telephone and the availability of in person appointments. I also discussed with the patient that there may be a patient responsible charge related to this service. The patient expressed understanding and agreed to proceed.   History of Present Illness: Patient presents for follow-up on his cymbalta. Is taking 60mg  daily for anxiety and sstates has been going well overall, nothing specific making him anxious. He would like to work on coming off the medication or at least titrating down. Medication makes him more drowsy. He is taking it in the mornings. Denies panic. Denies SI/HI  Depression screen [facility] 2/9 06/20/2018 04/18/2018 01/04/2018 11/12/2017 06/17/2017  Decreased Interest 0 0 0 0 0  Down, Depressed, Hopeless 0 0 0 0 0  PHQ - 2 Score 0 0 0 0 0  Altered sleeping - - 0 - -  Tired, decreased energy - - 0 - -  Change in appetite - - 0 - -  Feeling bad or failure about yourself  - - 0 - -  Moving slowly or fidgety/restless - - 0 - -  Suicidal thoughts - - 0 - -  PHQ-9 Score - - 0 - -  Difficult doing work/chores - - Not difficult at all - -   GAD 7 : Generalized Anxiety Score 06/20/2018 04/18/2018 11/30/2017  Nervous, Anxious, on Edge 3 0 2  Control/stop worrying 0 0 2  Worry too much - different things 0 0 3  Trouble relaxing 0 0 3  Restless 3 0 3  Easily annoyed or irritable 1 0 2  Afraid - awful might happen 0 0 0  Total GAD 7 Score 7 0 15  Anxiety Difficulty Not difficult at all Not difficult at all Very difficult       Observations/Objective: Alert and oriented   Assessment and Plan: 1. GAD (generalized anxiety disorder) Take one 30mg  cymbalta this morning and one 30mg   cymbalta in the evening and then transition to take 30mg  of cymbalta every evening until follow-up. If noticing increased anxiety please let us know right away.   2. Other specified counseling Discussed prevention of COVID19   Follow Up Instructions:  Follow up in 6 weeks.    I discussed the assessment and treatment plan with the patient. The patient was provided an opportunity to ask questions and all were answered. The patient agreed with the plan and demonstrated an understanding of the instructions.   The patient was advised to call back or seek an in-person evaluation if the symptoms worsen or if the condition fails to improve as anticipated.  I provided 5 minutes of non-face-to-face time during this encounter.   Fredderick Severance, NP

## 2018-07-26 ENCOUNTER — Other Ambulatory Visit: Payer: Self-pay | Admitting: Family Medicine

## 2019-01-06 ENCOUNTER — Other Ambulatory Visit: Payer: Self-pay

## 2019-01-06 DIAGNOSIS — F411 Generalized anxiety disorder: Secondary | ICD-10-CM

## 2019-01-06 DIAGNOSIS — M5430 Sciatica, unspecified side: Secondary | ICD-10-CM

## 2019-01-06 DIAGNOSIS — M549 Dorsalgia, unspecified: Secondary | ICD-10-CM

## 2019-01-06 NOTE — Telephone Encounter (Signed)
It looks like patient was working on titrating down on this medication - what dose does he take now? 1 tablet each night? Or still on twice daily?

## 2019-01-11 MED ORDER — DULOXETINE HCL 30 MG PO CPEP: ORAL_CAPSULE | ORAL | 1 refills | Status: DC

## 2019-01-11 NOTE — Telephone Encounter (Signed)
Patient stated that he is still taking 2 but both at bedtime.Which is working for him

## 2019-01-12 ENCOUNTER — Other Ambulatory Visit: Payer: Self-pay

## 2019-01-12 ENCOUNTER — Encounter: Payer: Self-pay | Admitting: Family Medicine

## 2019-01-12 ENCOUNTER — Ambulatory Visit (INDEPENDENT_AMBULATORY_CARE_PROVIDER_SITE_OTHER): Payer: 59 | Admitting: Family Medicine

## 2019-01-12 DIAGNOSIS — J069 Acute upper respiratory infection, unspecified: Secondary | ICD-10-CM

## 2019-01-12 DIAGNOSIS — F411 Generalized anxiety disorder: Secondary | ICD-10-CM | POA: Diagnosis not present

## 2019-01-12 MED ORDER — LEVOCETIRIZINE DIHYDROCHLORIDE 5 MG PO TABS: 5.0000 mg | ORAL_TABLET | Freq: Every evening | ORAL | 0 refills | Status: DC

## 2019-01-12 MED ORDER — FLUTICASONE PROPIONATE 50 MCG/ACT NA SUSP: 2.0000 | Freq: Every day | NASAL | 1 refills | Status: AC

## 2019-01-12 MED ORDER — GUAIFENESIN ER 600 MG PO TB12: 600.0000 mg | ORAL_TABLET | Freq: Two times a day (BID) | ORAL | 0 refills | Status: DC

## 2019-01-12 NOTE — Progress Notes (Signed)
Name: Willie Jones   MRN: [id number]    DOB: 10-Jul-1970   Date:01/12/2019       Progress Note  Subjective  Chief Complaint  Chief Complaint  Patient presents with  . Sinusitis    stuffy nose, dizziness, cough for 4 days    I connected with  Leward Demers Depaolo on 01/12/19 at 11:00 AM EDT by telephone and verified that I am speaking with the correct person using two identifiers.  I discussed the limitations, risks, security and privacy concerns of performing an evaluation and management service by telephone and the availability of in person appointments. Staff also discussed with the patient that there may be a patient responsible charge related to this service. Patient Location: Home Provider Location: Office Additional Individuals present: None  HPI  Pt presents with concern for sinus congestion and pain.  He works in [employer] and is outside a lot.  On Monday (about 4 days ago) he woke up feeling really congested, dizzy/swimmy headed upon waking, blew his nose and saw a bit of blood that morning.  He notes since then, the congestion is coming and going.  He tried to go for a run yesterday outside and felt like he was wheezing a bit and had significant sinus congestion.  Having coughing when he lays down. He does sometimes have allergies around this time of year.  Did have possible exposure - is being tested through [facility] as this was a work-related exposure.  Anxiety: He is taking Cymbalta 60mg  at night.  This is working well for him at this dose - no longer having panic attacks, no shortness of breath.  Denies SI/HI.  Does feel like his mood is stable at this time.  We will maintain current dosing.   Patient Active Problem List   Diagnosis Date Noted  . Facet arthropathy, lumbar 11/12/2017  . Degenerative arthritis of lumbar spine 11/12/2017  . Obesity (BMI 30.0-34.9) 06/17/2017  . Adhesive capsulitis of left knee 06/04/2017  . Rupture of left  patellar tendon 02/03/2017  . Hypertriglyceridemia 03/25/2016  . Preventative health care 08/12/2015  . Sleep apnea 08/12/2015  . Urinary frequency 08/12/2015  . Nocturia 08/12/2015  . Decreased libido 08/12/2015  . Atypical chest pain 11/02/2013  . Mixed hyperlipidemia     Past Surgical History:  Procedure Laterality Date  . APPENDECTOMY    . HERNIA REPAIR    . PATELLAR TENDON REPAIR Left 02/02/2017   Procedure: PATELLA TENDON REPAIR;  Surgeon: Corky Mull, MD;  Location: [facility] ORS;  Service: Orthopedics;  Laterality: Left;    Family History  Problem Relation Age of Onset  . Heart disease Father   . Heart attack Father   . Hypertension Father   . Hyperlipidemia Father   . Hypertension Mother   . Hyperlipidemia Brother   . Heart disease Paternal Grandfather     Social History   Socioeconomic History  . Marital status: Married    Spouse name: Roselyn Reef  . Number of children: 2  . Years of education: Not on file  . Highest education level: Bachelor's degree (e.g., BA, AB, BS)  Occupational History  . Not on file  Social Needs  . Financial resource strain: Not hard at all  . Food insecurity    Worry: Never true    Inability: Never true  . Transportation needs    Medical: No    Non-medical: No  Tobacco Use  . Smoking status: Never Smoker  .  Smokeless tobacco: Never Used  Substance and Sexual Activity  . Alcohol use: Yes    Comment: occasional  . Drug use: No  . Sexual activity: Yes    Partners: Female    Birth control/protection: None  Lifestyle  . Physical activity    Days per week: 7 days    Minutes per session: 150+ min  . Stress: Only a little  Relationships  . Social connections    Talks on phone: More than three times a week    Gets together: More than three times a week    Attends religious service: More than 4 times per year    Active member of club or organization: Yes    Attends meetings of clubs or organizations: More than 4 times per year     Relationship status: Married  . Intimate partner violence    Fear of current or ex partner: No    Emotionally abused: No    Physically abused: No    Forced sexual activity: No  Other Topics Concern  . Not on file  Social History Narrative  . Not on file     Current Outpatient Medications:  .  DULoxetine (CYMBALTA) 30 MG capsule, TAKE 2 CAPSULES BY MOUTH DAILY, Disp: 180 capsule, Rfl: 1 .  VASCEPA 1 g CAPS, TAKE 2 CAPSULES(2 GRAMS) BY MOUTH TWICE DAILY, Disp: 120 capsule, Rfl: 11  No Known Allergies  I personally reviewed active problem list, medication list, allergies, notes from last encounter, lab results with the patient/caregiver today.   ROS  Ten systems reviewed and is negative except as mentioned in HPI  Objective  Virtual encounter, vitals not obtained.  There is no height or weight on file to calculate BMI.  Physical Exam  Pulmonary/Chest: Effort normal. No respiratory distress. Speaking in complete sentences Neurological: Pt is alert and oriented to person, place, and time. Coordination, speech and gait are normal.  Psychiatric: Patient has a normal mood and affect. behavior is normal. Judgment and thought content normal.  No results found for this or any previous visit (from the past 72 hour(s)).  PHQ2/9: Depression screen [facility] 2/9 01/12/2019 06/20/2018 04/18/2018 01/04/2018 11/12/2017  Decreased Interest 1 0 0 0 0  Down, Depressed, Hopeless 1 0 0 0 0  PHQ - 2 Score 2 0 0 0 0  Altered sleeping 1 - - 0 -  Tired, decreased energy 1 - - 0 -  Change in appetite 1 - - 0 -  Feeling bad or failure about yourself  1 - - 0 -  Trouble concentrating 1 - - - -  Moving slowly or fidgety/restless 1 - - 0 -  Suicidal thoughts 0 - - 0 -  PHQ-9 Score 8 - - 0 -  Difficult doing work/chores Somewhat difficult - - Not difficult at all -   PHQ-2/9 Result is positive.    Fall Risk: Fall Risk  01/12/2019 06/20/2018 04/18/2018 01/04/2018 11/30/2017  Falls in the past year? 0 0  0 Yes No  Number falls in past yr: 0 0 0 1 -  Injury with Fall? 0 0 0 No -  Follow up Falls evaluation completed - - - -    Assessment & Plan  1. Viral upper respiratory tract infection - He is awaiting COVID testing from his job at this time - will message back if positive. - levocetirizine (XYZAL) 5 MG tablet; Take 1 tablet (5 mg total) by mouth every evening.  Dispense: 30 tablet; Refill: 0 - fluticasone (  FLONASE) 50 MCG/ACT nasal spray; Place 2 sprays into both nostrils daily.  Dispense: 16 g; Refill: 1 - guaiFENesin (MUCINEX) 600 MG 12 hr tablet; Take 1 tablet (600 mg total) by mouth 2 (two) times daily.  Dispense: 20 tablet; Refill: 0  2. GAD (generalized anxiety disorder) - Wants to maintain Cymbalta at this time; we will follow up in 3-4 months; overall his anxiety is much less and he is happy with where he is at with his mood and anxiety levels.  I discussed the assessment and treatment plan with the patient. The patient was provided an opportunity to ask questions and all were answered. The patient agreed with the plan and demonstrated an understanding of the instructions.   The patient was advised to call back or seek an in-person evaluation if the symptoms worsen or if the condition fails to improve as anticipated.  I provided 17 minutes of non-face-to-face time during this encounter.  Hubbard Hartshorn, FNP

## 2019-01-16 ENCOUNTER — Encounter: Payer: Self-pay | Admitting: Family Medicine

## 2019-03-03 ENCOUNTER — Telehealth: Payer: Self-pay | Admitting: Family Medicine

## 2019-03-03 IMAGING — US US EXTREM LOW*L* LIMITED
1 series · 14 of 19 positions shown · non-contrast
Comparison: None

CLINICAL DATA: Palpable abnormality LEFT groin/upper thigh

EXAM:
ULTRASOUND LEFT LOWER EXTREMITY LIMITED
TECHNIQUE: Ultrasound examination of the lower extremity soft tissues was
performed in the area of clinical concern.

[Series 1: us extrem low*left* limited · 19 acquisitions, 14 frames shown]
[im 1/19]
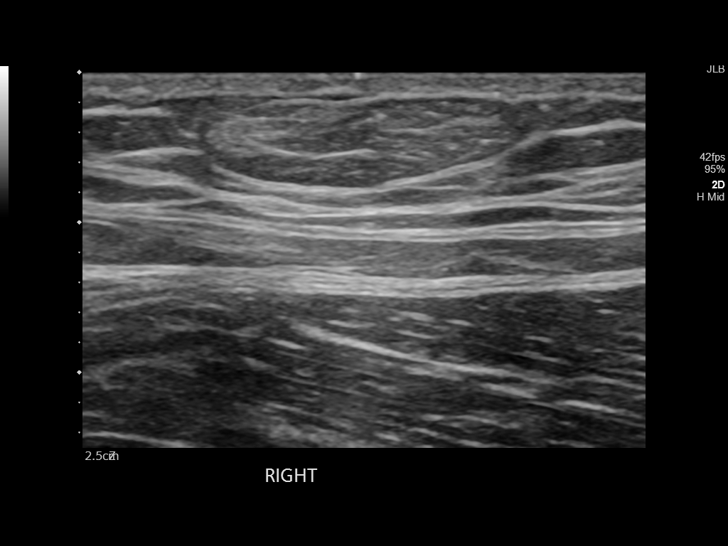
[im 3/19]
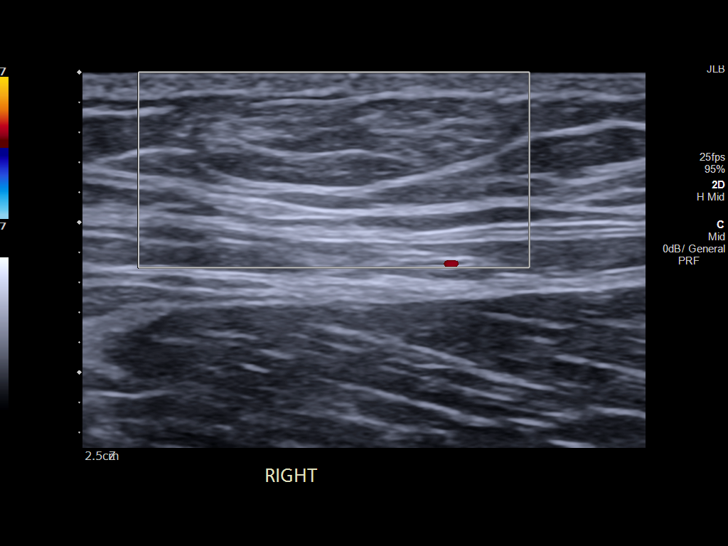
[im 4/19]
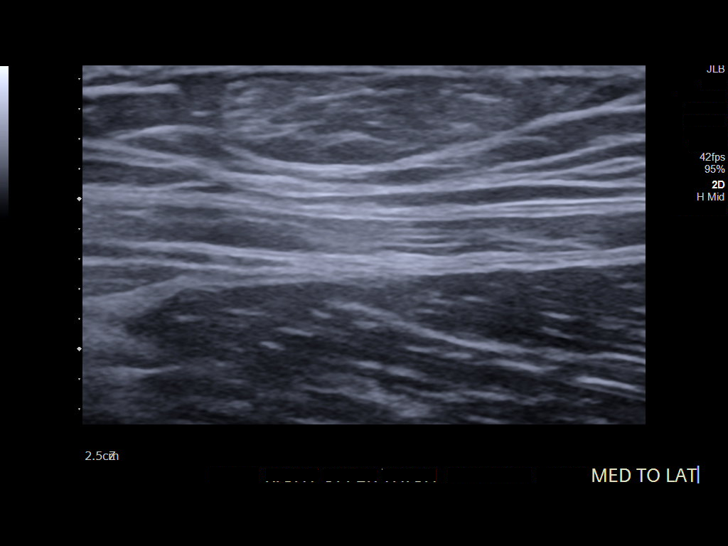
[im 5/19]
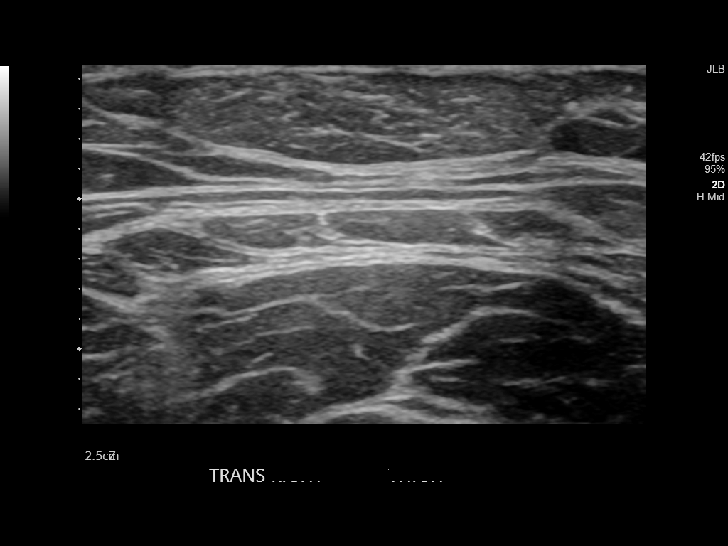
[im 7/19]
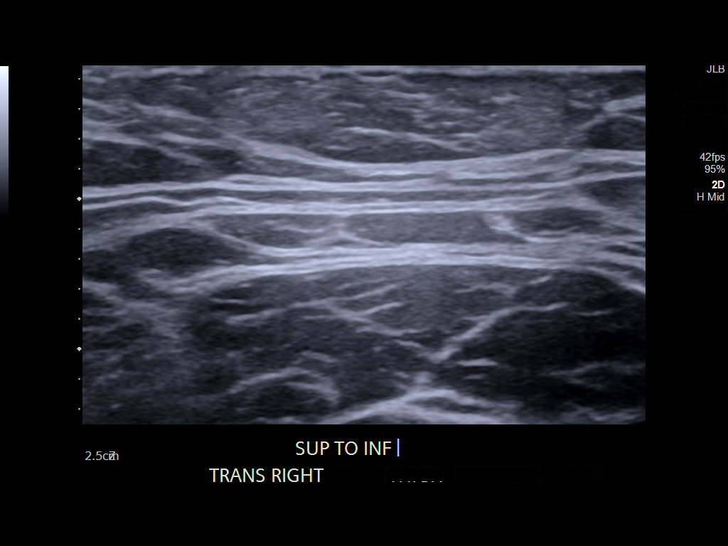
[im 8/19]
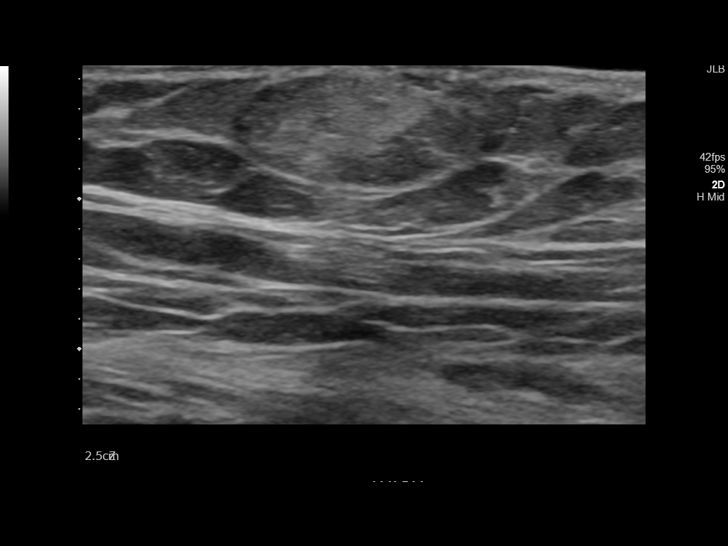
[im 9/19]
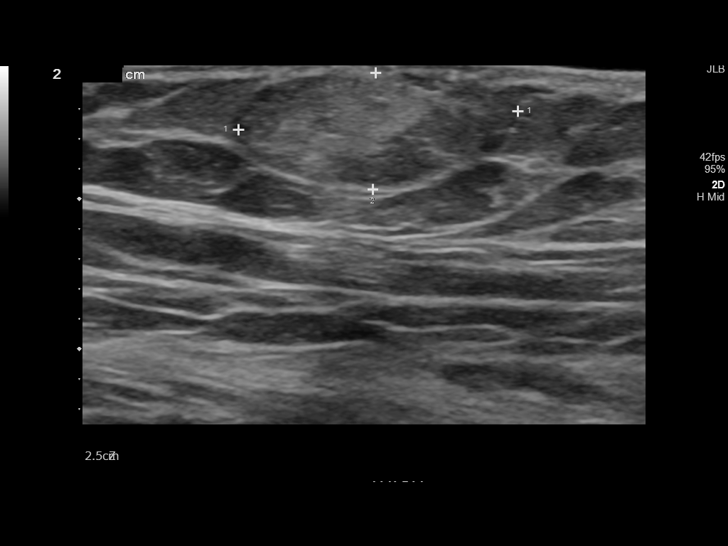
[im 11/19]
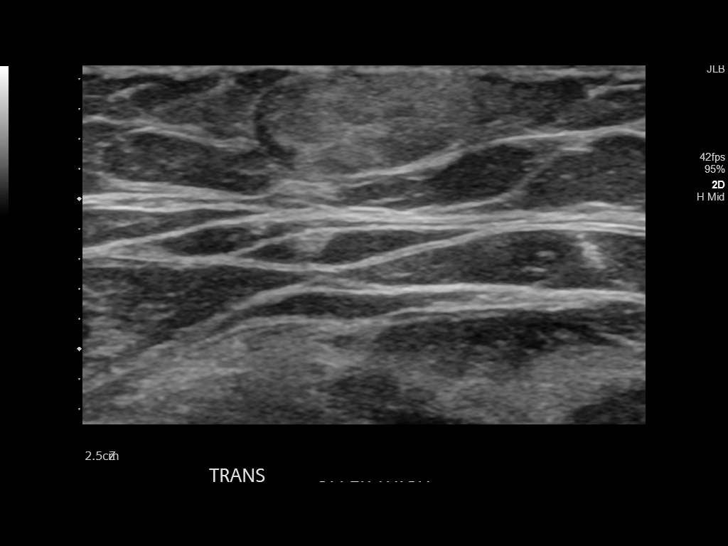
[im 12/19]
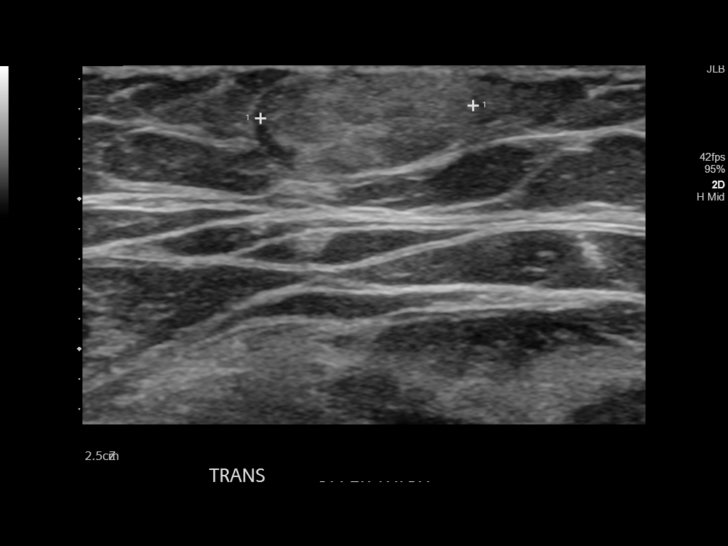
[im 13/19]
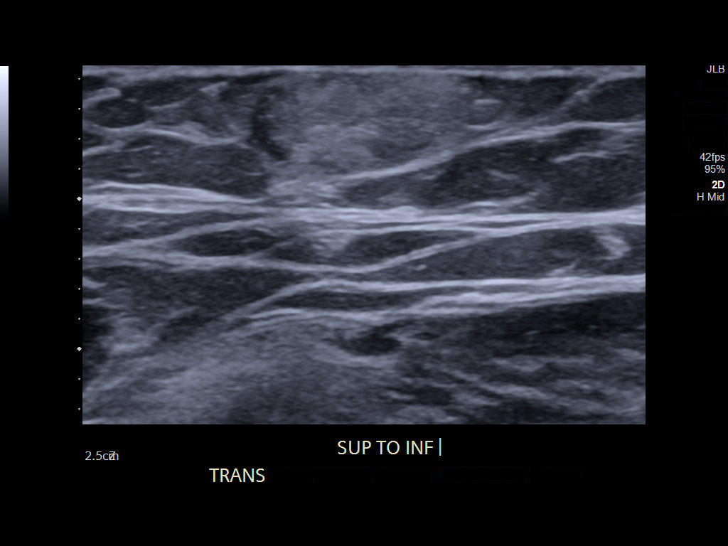
[im 15/19]
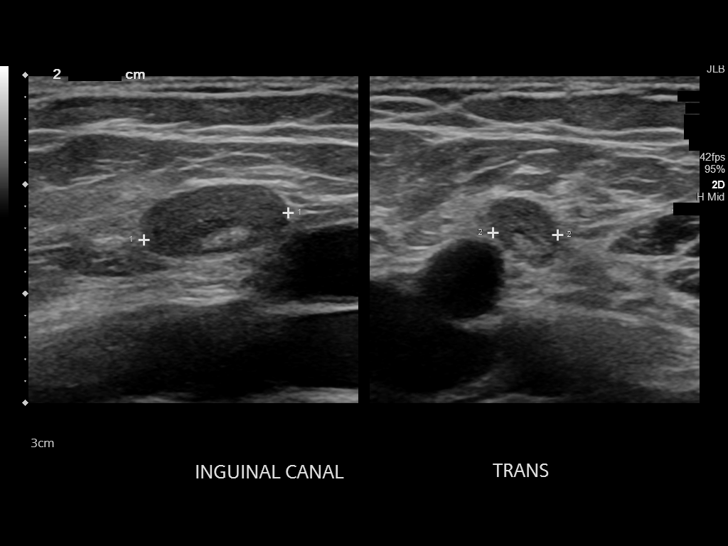
[im 16/19]
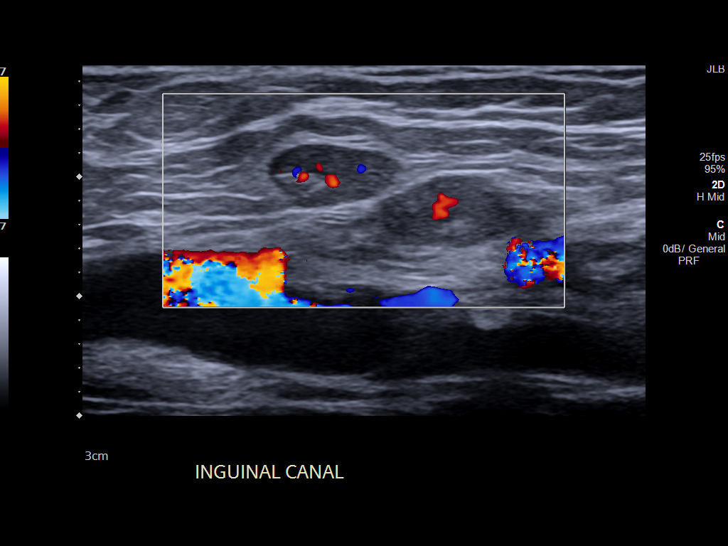
[im 17/19]
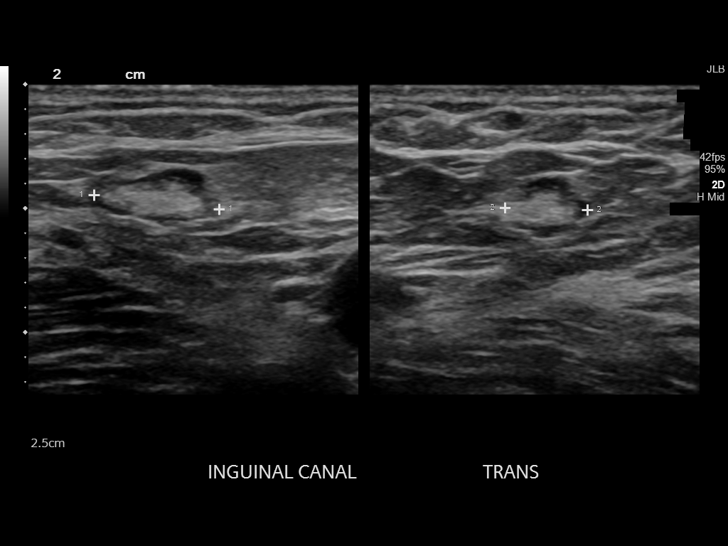
[im 19/19]
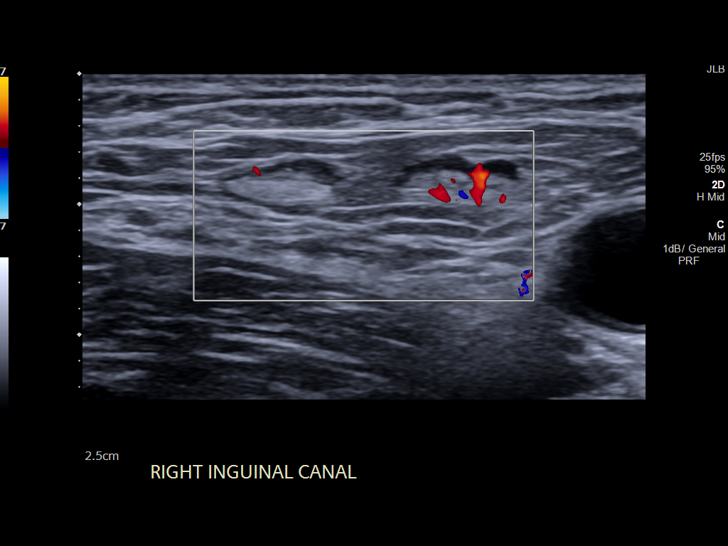

[14 of 19 positions shown; findings below may reference images not displayed]

FINDINGS: At the site of palpable concern, a mildly heterogeneous ovoid
subcutaneous nodules identified which measures 1.9 x 0.8 x 1.4 cm in
size.

No cystic change or calcification.

No internal blood flow on color Doppler imaging.

Sonographic characteristics are most consistent with a small
subcutaneous lipoma.

Also noted are several normal sized LEFT inguinal lymph nodes.
IMPRESSION: Small probable subcutaneous lipoma at the site of palpable concern
at the upper LEFT thigh.

## 2019-03-03 NOTE — Telephone Encounter (Signed)
Pt called and stated that he will need a PA for medication VASCEPA 1 g CAPS [id number].

## 2019-03-06 NOTE — Telephone Encounter (Signed)
It has been placed through cover my meds

## 2019-07-06 ENCOUNTER — Other Ambulatory Visit: Payer: Self-pay | Admitting: Family Medicine

## 2019-07-06 DIAGNOSIS — M5430 Sciatica, unspecified side: Secondary | ICD-10-CM

## 2019-07-06 DIAGNOSIS — M549 Dorsalgia, unspecified: Secondary | ICD-10-CM

## 2019-07-06 DIAGNOSIS — F411 Generalized anxiety disorder: Secondary | ICD-10-CM

## 2022-03-02 ENCOUNTER — Other Ambulatory Visit: Payer: Self-pay

## 2022-03-02 ENCOUNTER — Telehealth: Payer: Self-pay

## 2022-03-02 DIAGNOSIS — Z1211 Encounter for screening for malignant neoplasm of colon: Secondary | ICD-10-CM

## 2022-03-02 NOTE — Telephone Encounter (Signed)
Gastroenterology Pre-Procedure Review  Request Date: 03/19/2022 Requesting Physician: Dr. Norton Blizzard he has Suprep at home already so does not need prep called in.  PATIENT REVIEW QUESTIONS: The patient responded to the following health history questions as indicated:    1. Are you having any GI issues? no 2. Do you have a personal history of Polyps? no 3. Do you have a family history of Colon Cancer or Polyps? Paternal grandfather  40. Diabetes Mellitus? no 5. Joint replacements in the past 12 months?no 6. Major health problems in the past 3 months?no 7. Any artificial heart valves, MVP, or defibrillator?no    MEDICATIONS & ALLERGIES:    Patient reports the following regarding taking any anticoagulation/antiplatelet therapy:   Plavix, Coumadin, Eliquis, Xarelto, Lovenox, Pradaxa, Brilinta, or Effient? no Aspirin? no  Patient confirms/reports the following medications:  Current Outpatient Medications  Medication Sig Dispense Refill   DULoxetine (CYMBALTA) 30 MG capsule TAKE 2 CAPSULES BY MOUTH DAILY. 180 capsule 1   fluticasone (FLONASE) 50 MCG/ACT nasal spray Place 2 sprays into both nostrils daily. 16 g 1   guaiFENesin (MUCINEX) 600 MG 12 hr tablet Take 1 tablet (600 mg total) by mouth 2 (two) times daily. 20 tablet 0   levocetirizine (XYZAL) 5 MG tablet Take 1 tablet (5 mg total) by mouth every evening. 30 tablet 0   VASCEPA 1 g CAPS TAKE 2 CAPSULES(2 GRAMS) BY MOUTH TWICE DAILY 120 capsule 11   No current facility-administered medications for this visit.    Patient confirms/reports the following allergies:  No Known Allergies  No orders of the defined types were placed in this encounter.   AUTHORIZATION INFORMATION Primary Insurance: 1D#: Group #:  Secondary Insurance: 1D#: Group #:  SCHEDULE INFORMATION: Date:  Time: Location:

## 2022-03-09 ENCOUNTER — Encounter: Payer: Self-pay | Admitting: Gastroenterology

## 2022-03-19 ENCOUNTER — Encounter
Admission: RE | Disposition: A | Discharge status: Home / Self Care | Payer: Self-pay | Source: Home / Self Care | Attending: Gastroenterology

## 2022-03-19 ENCOUNTER — Ambulatory Visit: Payer: No Typology Code available for payment source | Admitting: Anesthesiology

## 2022-03-19 ENCOUNTER — Ambulatory Visit
Admission: RE | Admit: 2022-03-19 | Discharge: 2022-03-19 | Disposition: A | Discharge status: Home / Self Care | Payer: No Typology Code available for payment source | Attending: Gastroenterology | Admitting: Gastroenterology

## 2022-03-19 ENCOUNTER — Encounter: Payer: Self-pay | Admitting: Gastroenterology

## 2022-03-19 ENCOUNTER — Other Ambulatory Visit: Payer: Self-pay

## 2022-03-19 DIAGNOSIS — Z6829 Body mass index (BMI) 29.0-29.9, adult: Secondary | ICD-10-CM | POA: Insufficient documentation

## 2022-03-19 DIAGNOSIS — K219 Gastro-esophageal reflux disease without esophagitis: Secondary | ICD-10-CM

## 2022-03-19 DIAGNOSIS — G4730 Sleep apnea, unspecified: Secondary | ICD-10-CM | POA: Insufficient documentation

## 2022-03-19 DIAGNOSIS — K621 Rectal polyp: Secondary | ICD-10-CM

## 2022-03-19 DIAGNOSIS — R1013 Epigastric pain: Secondary | ICD-10-CM

## 2022-03-19 DIAGNOSIS — F419 Anxiety disorder, unspecified: Secondary | ICD-10-CM | POA: Diagnosis not present

## 2022-03-19 DIAGNOSIS — E782 Mixed hyperlipidemia: Secondary | ICD-10-CM | POA: Insufficient documentation

## 2022-03-19 DIAGNOSIS — E669 Obesity, unspecified: Secondary | ICD-10-CM | POA: Diagnosis not present

## 2022-03-19 DIAGNOSIS — J45909 Unspecified asthma, uncomplicated: Secondary | ICD-10-CM | POA: Insufficient documentation

## 2022-03-19 DIAGNOSIS — K2950 Unspecified chronic gastritis without bleeding: Secondary | ICD-10-CM | POA: Diagnosis not present

## 2022-03-19 DIAGNOSIS — Z1211 Encounter for screening for malignant neoplasm of colon: Secondary | ICD-10-CM

## 2022-03-19 HISTORY — PX: POLYPECTOMY: SHX5525

## 2022-03-19 HISTORY — PX: COLONOSCOPY WITH PROPOFOL: SHX5780

## 2022-03-19 HISTORY — PX: ESOPHAGOGASTRODUODENOSCOPY (EGD) WITH PROPOFOL: SHX5813

## 2022-03-19 SURGERY — COLONOSCOPY WITH PROPOFOL
Anesthesia: General | Site: Rectum

## 2022-03-19 MED ORDER — STERILE WATER FOR IRRIGATION IR SOLN
Status: DC | PRN
  Administered 2022-03-19: 60 mL

## 2022-03-19 MED ORDER — LACTATED RINGERS IV SOLN: INTRAVENOUS | Status: DC

## 2022-03-19 MED ORDER — SODIUM CHLORIDE 0.9 % IV SOLN: INTRAVENOUS | Status: DC

## 2022-03-19 MED ORDER — STERILE WATER FOR IRRIGATION IR SOLN
Status: DC | PRN
  Administered 2022-03-19: 1

## 2022-03-19 MED ORDER — DEXMEDETOMIDINE HCL IN NACL 80 MCG/20ML IV SOLN
INTRAVENOUS | Status: DC | PRN
  Administered 2022-03-19: 8 ug via BUCCAL

## 2022-03-19 MED ORDER — LIDOCAINE HCL (CARDIAC) PF 100 MG/5ML IV SOSY
PREFILLED_SYRINGE | INTRAVENOUS | Status: DC | PRN
  Administered 2022-03-19: 60 mg via INTRAVENOUS

## 2022-03-19 MED ORDER — PROPOFOL 10 MG/ML IV BOLUS
INTRAVENOUS | Status: DC | PRN
  Administered 2022-03-19: 50 mg via INTRAVENOUS
  Administered 2022-03-19: 200 ug/kg/min via INTRAVENOUS
  Administered 2022-03-19: 50 mg via INTRAVENOUS

## 2022-03-19 SURGICAL SUPPLY — 36 items
BALLN DILATOR 12-15 8 (BALLOONS)
BALLN DILATOR 15-18 8 (BALLOONS)
BALLN DILATOR CRE 0-12 8 (BALLOONS)
BALLN DILATOR ESOPH 8 10 CRE (MISCELLANEOUS) IMPLANT
BALLOON DILATOR 12-15 8 (BALLOONS) IMPLANT
BALLOON DILATOR 15-18 8 (BALLOONS) IMPLANT
BALLOON DILATOR CRE 0-12 8 (BALLOONS) IMPLANT
BLOCK BITE 60FR ADLT L/F GRN (MISCELLANEOUS) ×3 IMPLANT
CLIP HMST 235XBRD CATH ROT (MISCELLANEOUS) IMPLANT
CLIP RESOLUTION 360 11X235 (MISCELLANEOUS)
ELECT REM PT RETURN 9FT ADLT (ELECTROSURGICAL)
ELECTRODE REM PT RTRN 9FT ADLT (ELECTROSURGICAL) IMPLANT
FCP ESCP3.2XJMB 240X2.8X (MISCELLANEOUS) ×3
FORCEPS BIOP RAD 4 LRG CAP 4 (CUTTING FORCEPS) IMPLANT
FORCEPS BIOP RJ4 240 W/NDL (MISCELLANEOUS) ×3
FORCEPS ESCP3.2XJMB 240X2.8X (MISCELLANEOUS) IMPLANT
GOWN CVR UNV OPN BCK APRN NK (MISCELLANEOUS) ×6 IMPLANT
GOWN ISOL THUMB LOOP REG UNIV (MISCELLANEOUS) ×6
INJECTOR VARIJECT VIN23 (MISCELLANEOUS) IMPLANT
KIT DEFENDO VALVE AND CONN (KITS) IMPLANT
KIT PRC NS LF DISP ENDO (KITS) ×3 IMPLANT
KIT PROCEDURE OLYMPUS (KITS) ×3
MANIFOLD NEPTUNE II (INSTRUMENTS) ×3 IMPLANT
MARKER SPOT ENDO TATTOO 5ML (MISCELLANEOUS) IMPLANT
PROBE APC STR FIRE (PROBE) IMPLANT
RETRIEVER NET PLAT FOOD (MISCELLANEOUS) IMPLANT
RETRIEVER NET ROTH 2.5X230 LF (MISCELLANEOUS) IMPLANT
SNARE COLD EXACTO (MISCELLANEOUS) IMPLANT
SNARE SHORT THROW 13M SML OVAL (MISCELLANEOUS) IMPLANT
SNARE SHORT THROW 30M LRG OVAL (MISCELLANEOUS) IMPLANT
SNARE SNG USE RND 15MM (INSTRUMENTS) IMPLANT
SYR INFLATION 60ML (SYRINGE) IMPLANT
TRAP ETRAP POLY (MISCELLANEOUS) IMPLANT
VARIJECT INJECTOR VIN23 (MISCELLANEOUS)
WATER STERILE IRR 250ML POUR (IV SOLUTION) ×3 IMPLANT
WIRE CRE 18-20MM 8CM F G (MISCELLANEOUS) IMPLANT

## 2022-03-19 NOTE — Op Note (Signed)
[facility] Gastroenterology Patient Name: Willie Jones Procedure Date: 03/19/2022 9:37 AM MRN: [id number] Account #: [id number] Date of Birth: 1970-07-13 Admit Type: Outpatient Age: 51 Room: [facility] OR ROOM 01 Gender: Male Note Status: Finalized Instrument Name: [id number] Procedure:             Colonoscopy Indications:           Screening for colorectal malignant neoplasm, This is                         the patient's first colonoscopy Providers:             Lin Landsman MD, MD Referring MD:          [facility] (Referring MD) Medicines:             General Anesthesia Complications:         No immediate complications. Estimated blood loss: None. Procedure:             Pre-Anesthesia Assessment:                        - Prior to the procedure, a History and Physical was                         performed, and patient medications and allergies were                         reviewed. The patient is competent. The risks and                         benefits of the procedure and the sedation options and                         risks were discussed with the patient. All questions                         were answered and informed consent was obtained.                         Patient identification and proposed procedure were                         verified by the physician, the nurse, the                         anesthesiologist, the anesthetist and the technician                         in the pre-procedure area in the procedure room in the                         endoscopy suite. Mental Status Examination: alert and                         oriented. Airway Examination: normal oropharyngeal                         airway and neck mobility. Respiratory Examination:  clear to auscultation. CV Examination: normal.                         Prophylactic Antibiotics: The patient does not require                         prophylactic  antibiotics. Prior Anticoagulants: The                         patient has taken no anticoagulant or antiplatelet                         agents. ASA Grade Assessment: II - A patient with mild                         systemic disease. After reviewing the risks and                         benefits, the patient was deemed in satisfactory                         condition to undergo the procedure. The anesthesia                         plan was to use general anesthesia. Immediately prior                         to administration of medications, the patient was                         re-assessed for adequacy to receive sedatives. The                         heart rate, respiratory rate, oxygen saturations,                         blood pressure, adequacy of pulmonary ventilation, and                         response to care were monitored throughout the                         procedure. The physical status of the patient was                         re-assessed after the procedure.                        After obtaining informed consent, the colonoscope was                         passed under direct vision. Throughout the procedure,                         the patient's blood pressure, pulse, and oxygen                         saturations were monitored continuously. The was  introduced through the anus and advanced to the the                         cecum, identified by appendiceal orifice and ileocecal                         valve. The colonoscopy was performed without                         difficulty. The patient tolerated the procedure well.                         The quality of the bowel preparation was evaluated                         using the BBPS [facility] Bowel Preparation Scale) with                         scores of: Right Colon = 3, Transverse Colon = 3 and                         Left Colon = 3 (entire mucosa seen well with no                          residual staining, small fragments of stool or opaque                         liquid). The total BBPS score equals 9. The ileocecal                         valve, appendiceal orifice, and rectum were                         photographed. Findings:      The perianal and digital rectal examinations were normal. Pertinent       negatives include normal sphincter tone and no palpable rectal lesions.      A diminutive polyp was found in the distal rectum. The polyp was       sessile. The polyp was removed with a jumbo cold forceps. Resection and       retrieval were complete. Estimated blood loss: none.      The retroflexed view of the distal rectum and anal verge was normal and       showed no anal or rectal abnormalities.      The exam was otherwise without abnormality. Impression:            - One diminutive polyp in the distal rectum, removed                         with a jumbo cold forceps. Resected and retrieved.                        - The distal rectum and anal verge are normal on                         retroflexion view.                        -  The examination was otherwise normal. Recommendation:        - Discharge patient to home (with escort).                        - Resume previous diet today.                        - Continue present medications.                        - Await pathology results.                        - Repeat colonoscopy in 7-10 years for surveillance                         based on pathology results. Procedure Code(s):     --- Professional ---                        [phone], Colonoscopy, flexible; with biopsy, single or                         multiple Diagnosis Code(s):     --- Professional ---                        Z12.11, Encounter for screening for malignant neoplasm                         of colon                        D12.8, Benign neoplasm of rectum CPT copyright 2022 American Medical Association. All rights reserved. The codes documented in  this report are preliminary and upon coder review may  be revised to meet current compliance requirements. Dr. Ulyess Mort Lin Landsman MD, MD 03/19/2022 10:15:01 AM This report has been signed electronically. Number of Addenda: 0 Note Initiated On: 03/19/2022 9:37 AM Scope Withdrawal Time: 0 hours 15 minutes 44 seconds  Total Procedure Duration: 0 hours 20 minutes 6 seconds  Estimated Blood Loss:  Estimated blood loss: none.      [facility]

## 2022-03-19 NOTE — Anesthesia Preprocedure Evaluation (Signed)
Anesthesia Evaluation  Patient identified by MRN, date of birth, ID band Patient awake    Reviewed: Allergy & Precautions, NPO status , Patient's Chart, lab work & pertinent test results  History of Anesthesia Complications Negative for: history of anesthetic complications  Airway Mallampati: II  TM Distance: >3 FB Neck ROM: Full    Dental no notable dental hx. (+) Dental Advidsory Given   Pulmonary neg shortness of breath, asthma , sleep apnea and Continuous Positive Airway Pressure Ventilation , neg COPD   breath sounds clear to auscultation- rhonchi (-) wheezing      Cardiovascular Exercise Tolerance: Good (-) hypertension(-) CAD, (-) Past MI and (-) Cardiac Stents  Rhythm:Regular Rate:Normal - Systolic murmurs and - Diastolic murmurs    Neuro/Psych  PSYCHIATRIC DISORDERS Anxiety     negative neurological ROS     GI/Hepatic negative GI ROS, Neg liver ROS,,,  Endo/Other  negative endocrine ROSneg diabetes    Renal/GU negative Renal ROS     Musculoskeletal negative musculoskeletal ROS (+)    Abdominal  (+) + obese  Peds  Hematology negative hematology ROS (+)   Anesthesia Other Findings Past Medical History: No date: Acute prostatitis No date: Bronchitis No date: Cancer ([place])     Comment:  skin(face) No date: Decreased libido No date: Insomnia, unspecified No date: Mixed hyperlipidemia No date: Other malaise and fatigue 08/12/2015: Sleep apnea No date: Thoracic or lumbosacral neuritis or radiculitis, unspecified   Reproductive/Obstetrics                             Anesthesia Physical Anesthesia Plan  ASA: II  Anesthesia Plan: General   Post-op Pain Management:  Regional for Post-op pain and Minimal or no pain anticipated   Induction: Intravenous  PONV Risk Score and Plan: 1 and Propofol infusion and TIVA  Airway Management Planned: Nasal Cannula and Natural  Airway  Additional Equipment: None  Intra-op Plan:   Post-operative Plan:   Informed Consent: I have reviewed the patients History and Physical, chart, labs and discussed the procedure including the risks, benefits and alternatives for the proposed anesthesia with the patient or authorized representative who has indicated his/her understanding and acceptance.     Dental advisory given  Plan Discussed with: CRNA and Anesthesiologist  Anesthesia Plan Comments: (Discussed risks of anesthesia with patient, including possibility of difficulty with spontaneous ventilation under anesthesia necessitating airway intervention, PONV, and rare risks such as cardiac or respiratory or neurological events, and allergic reactions. Discussed the role of CRNA in patient's perioperative care. Patient understands.)        Anesthesia Quick Evaluation

## 2022-03-19 NOTE — Anesthesia Postprocedure Evaluation (Signed)
Anesthesia Post Note  Patient: Lawerance Matsuo Neider  Procedure(s) Performed: COLONOSCOPY WITH PROPOFOL (Rectum) ESOPHAGOGASTRODUODENOSCOPY (EGD) WITH PROPOFOL (Esophagus) POLYPECTOMY (Rectum)  Patient location during evaluation: PACU Anesthesia Type: General Level of consciousness: awake and alert Pain management: pain level controlled Vital Signs Assessment: post-procedure vital signs reviewed and stable Respiratory status: spontaneous breathing, nonlabored ventilation, respiratory function stable and patient connected to nasal cannula oxygen Cardiovascular status: blood pressure returned to baseline and stable Postop Assessment: no apparent nausea or vomiting Anesthetic complications: no  There were no known notable events for this encounter.   Last Vitals:  Vitals:   03/19/22 1018 03/19/22 1030  BP: 105/72 112/73  Pulse: 67 67  Resp: 13 17  Temp: (!) 36.3 C   SpO2: 98% 97%    Last Pain:  Vitals:   03/19/22 1030  PainSc: 0-No pain                 Dimas Millin

## 2022-03-19 NOTE — Op Note (Signed)
[facility] Gastroenterology Patient Name: Willie Jones Procedure Date: 03/19/2022 9:38 AM MRN: [id number] Account #: [id number] Date of Birth: [birth year] Admit Type: Outpatient Age: 51 Room: [facility] OR ROOM 01 Gender: Male Note Status: Finalized Instrument Name: [id number] Procedure:             Upper GI endoscopy Indications:           Dyspepsia, Follow-up of gastro-esophageal reflux                         disease Providers:             Lin Landsman MD, MD Referring MD:          [facility] (Referring MD) Medicines:             General Anesthesia Complications:         No immediate complications. Estimated blood loss: None. Procedure:             Pre-Anesthesia Assessment:                        - Prior to the procedure, a History and Physical was                         performed, and patient medications and allergies were                         reviewed. The patient is competent. The risks and                         benefits of the procedure and the sedation options and                         risks were discussed with the patient. All questions                         were answered and informed consent was obtained.                         Patient identification and proposed procedure were                         verified by the physician, the nurse, the                         anesthesiologist, the anesthetist and the technician                         in the pre-procedure area in the procedure room in the                         endoscopy suite. Mental Status Examination: alert and                         oriented. Airway Examination: normal oropharyngeal                         airway and neck mobility. Respiratory Examination:  clear to auscultation. CV Examination: normal.                         Prophylactic Antibiotics: The patient does not require                         prophylactic antibiotics. Prior  Anticoagulants: The                         patient has taken no anticoagulant or antiplatelet                         agents. ASA Grade Assessment: II - A patient with mild                         systemic disease. After reviewing the risks and                         benefits, the patient was deemed in satisfactory                         condition to undergo the procedure. The anesthesia                         plan was to use general anesthesia. Immediately prior                         to administration of medications, the patient was                         re-assessed for adequacy to receive sedatives. The                         heart rate, respiratory rate, oxygen saturations,                         blood pressure, adequacy of pulmonary ventilation, and                         response to care were monitored throughout the                         procedure. The physical status of the patient was                         re-assessed after the procedure.                        After obtaining informed consent, the endoscope was                         passed under direct vision. Throughout the procedure,                         the patient's blood pressure, pulse, and oxygen                         saturations were monitored continuously. The Endoscope  was introduced through the mouth, and advanced to the                         second part of duodenum. The upper GI endoscopy was                         accomplished without difficulty. The patient tolerated                         the procedure well. Findings:      The duodenal bulb and examined duodenum were normal.      The entire examined stomach was normal. Biopsies were taken with a cold       forceps for histology.      The cardia and gastric fundus were normal on retroflexion.      Esophagogastric landmarks were identified: the gastroesophageal junction       was found at 40 cm from the incisors.       The gastroesophageal junction and examined esophagus were normal.       Biopsies were taken with a cold forceps for histology. Impression:            - Normal duodenal bulb and examined duodenum.                        - Normal stomach. Biopsied.                        - Esophagogastric landmarks identified.                        - Normal gastroesophageal junction and esophagus.                         Biopsied. Recommendation:        - Await pathology results.                        - Proceed with colonoscopy as scheduled                        See colonoscopy report Procedure Code(s):     --- Professional ---                        [id number], Esophagogastroduodenoscopy, flexible,                         transoral; with biopsy, single or multiple Diagnosis Code(s):     --- Professional ---                        R10.13, Epigastric pain                        K21.9, Gastro-esophageal reflux disease without                         esophagitis CPT copyright 2022 American Medical Association. All rights reserved. The codes documented in this report are preliminary and upon coder review may  be revised to meet current compliance requirements. Dr. Ulyess Mort Lin Landsman MD, MD 03/19/2022 9:51:15 AM This report has been  signed electronically. Number of Addenda: 0 Note Initiated On: 03/19/2022 9:38 AM Total Procedure Duration: 0 hours 3 minutes 57 seconds  Estimated Blood Loss:  Estimated blood loss: none.      [facility]

## 2022-03-19 NOTE — Transfer of Care (Signed)
Immediate Anesthesia Transfer of Care Note  Patient: Willie Jones  Procedure(s) Performed: COLONOSCOPY WITH PROPOFOL (Rectum) ESOPHAGOGASTRODUODENOSCOPY (EGD) WITH PROPOFOL (Esophagus) POLYPECTOMY (Rectum)  Patient Location: PACU  Anesthesia Type: General  Level of Consciousness: awake, alert  and patient cooperative  Airway and Oxygen Therapy: Patient Spontanous Breathing and Patient connected to supplemental oxygen  Post-op Assessment: Post-op Vital signs reviewed, Patient's Cardiovascular Status Stable, Respiratory Function Stable, Patent Airway and No signs of Nausea or vomiting  Post-op Vital Signs: Reviewed and stable  Complications: No notable events documented.

## 2022-03-19 NOTE — H&P (Signed)
Cephas Darby, MD [address]  [place]  [place], [place] [address]  Main: [phone]  Fax: [phone] Pager: [phone]  Primary Care Physician:  [facility] Primary Gastroenterologist:  Dr. Cephas Darby  Pre-Procedure History & Physical: HPI:  Willie Jones is a 51 y.o. male is here for an EGD and a colonoscopy.   Past Medical History:  Diagnosis Date   Acute prostatitis    Bronchitis    Cancer ([place])    skin(face)   Decreased libido    Degenerative arthritis of lumbar spine 11/12/2017   Noted on xray 2007   Facet arthropathy, lumbar 11/12/2017   Noted xrays 2007   Insomnia, unspecified    Mixed hyperlipidemia    Other malaise and fatigue    Sleep apnea 08/12/2015   Thoracic or lumbosacral neuritis or radiculitis, unspecified     Past Surgical History:  Procedure Laterality Date   APPENDECTOMY     HERNIA REPAIR     PATELLAR TENDON REPAIR Left 02/02/2017   Procedure: PATELLA TENDON REPAIR;  Surgeon: Corky Mull, MD;  Location: [facility] ORS;  Service: Orthopedics;  Laterality: Left;    Prior to Admission medications   Medication Sig Start Date End Date Taking? Authorizing Provider  DULoxetine (CYMBALTA) 30 MG capsule TAKE 2 CAPSULES BY MOUTH DAILY. Patient not taking: Reported on 03/09/2022 07/06/19   Hubbard Hartshorn, FNP  fluticasone [facility]) 50 MCG/ACT nasal spray Place 2 sprays into both nostrils daily. 01/12/19   Hubbard Hartshorn, FNP  guaiFENesin (MUCINEX) 600 MG 12 hr tablet Take 1 tablet (600 mg total) by mouth 2 (two) times daily. Patient not taking: Reported on 03/09/2022 01/12/19   Hubbard Hartshorn, FNP  levocetirizine (XYZAL) 5 MG tablet Take 1 tablet (5 mg total) by mouth every evening. Patient not taking: Reported on 03/09/2022 01/12/19   Hubbard Hartshorn, FNP  VASCEPA 1 g CAPS TAKE 2 CAPSULES(2 GRAMS) BY MOUTH TWICE DAILY Patient not taking: Reported on 03/09/2022 07/26/18   Fredderick Severance, NP    Allergies as of 03/02/2022    (No Known Allergies)    Family History  Problem Relation Age of Onset   Heart disease Father    Heart attack Father    Hypertension Father    Hyperlipidemia Father    Hypertension Mother    Hyperlipidemia Brother    Heart disease Paternal Grandfather     Social History   Socioeconomic History   Marital status: Married    Spouse name: Roselyn Reef   Number of children: 2   Years of education: Not on file   Highest education level: Bachelor's degree (e.g., BA, AB, BS)  Occupational History   Not on file  Tobacco Use   Smoking status: Never   Smokeless tobacco: Never  Vaping Use   Vaping Use: Never used  Substance and Sexual Activity   Alcohol use: Yes    Comment: occasional   Drug use: No   Sexual activity: Yes    Partners: Female    Birth control/protection: None  Other Topics Concern   Not on file  Social History Narrative   Not on file   Social Determinants of Health   Financial Resource Strain: Low Risk  (04/18/2018)   Overall Financial Resource Strain (CARDIA)    Difficulty of Paying Living Expenses: Not hard at all  Food Insecurity: No Food Insecurity (04/18/2018)   Hunger Vital Sign    Worried About Running Out of Food in the Last  Year: Never true    Waldo in the Last Year: Never true  Transportation Needs: No Transportation Needs (04/18/2018)   PRAPARE - Hydrologist (Medical): No    Lack of Transportation (Non-Medical): No  Physical Activity: Sufficiently Active (04/18/2018)   Exercise Vital Sign    Days of Exercise per Week: 7 days    Minutes of Exercise per Session: 150+ min  Stress: No Stress Concern Present (06/20/2018)   [place]    Feeling of Stress : Only a little  Social Connections: Socially Integrated (04/18/2018)   Social Connection and Isolation Panel [NHANES]    Frequency of Communication with Friends and Family: More than three times a week     Frequency of Social Gatherings with Friends and Family: More than three times a week    Attends Religious Services: More than 4 times per year    Active Member of [employer] or Organizations: Yes    Attends Music therapist: More than 4 times per year    Marital Status: Married  Human resources officer Violence: Not At Risk (04/18/2018)   Humiliation, Afraid, Rape, and Kick questionnaire    Fear of Current or Ex-Partner: No    Emotionally Abused: No    Physically Abused: No    Sexually Abused: No    Review of Systems: See HPI, otherwise negative ROS  Physical Exam: BP 125/87   Pulse 68   Temp 98.2 F (36.8 C)   Ht '5\' 10"'$  (1.778 m)   Wt 93.9 kg   SpO2 97%   BMI 29.70 kg/m  General:   Alert,  pleasant and cooperative in NAD Head:  Normocephalic and atraumatic. Neck:  Supple; no masses or thyromegaly. Lungs:  Clear throughout to auscultation.    Heart:  Regular rate and rhythm. Abdomen:  Soft, nontender and nondistended. Normal bowel sounds, without guarding, and without rebound.   Neurologic:  Alert and  oriented x4;  grossly normal neurologically.  Impression/Plan: Willie Jones is here for an EGD and a colonoscopy to be performed for chronic GERD and colon cancer screening  Risks, benefits, limitations, and alternatives regarding  endoscopy and colonoscopy have been reviewed with the patient.  Questions have been answered.  All parties agreeable.   Sherri Sear, MD  03/19/2022, 8:54 AM

## 2022-03-24 LAB — SURGICAL PATHOLOGY

## 2022-03-27 ENCOUNTER — Encounter: Payer: Self-pay | Admitting: Gastroenterology
